# Patient Record
Sex: Female | Born: 1995 | Race: Black or African American | Hispanic: No | Marital: Single | Smoking: Never smoker
Health system: Southern US, Community
[De-identification: ages and names within clinical notes are randomized; demographics above are authoritative.]

## PROBLEM LIST (undated history)

## (undated) DIAGNOSIS — K900 Celiac disease: Secondary | ICD-10-CM

## (undated) DIAGNOSIS — K769 Liver disease, unspecified: Secondary | ICD-10-CM

## (undated) DIAGNOSIS — J309 Allergic rhinitis, unspecified: Secondary | ICD-10-CM

## (undated) DIAGNOSIS — G43909 Migraine, unspecified, not intractable, without status migrainosus: Secondary | ICD-10-CM

## (undated) DIAGNOSIS — K5190 Ulcerative colitis, unspecified, without complications: Secondary | ICD-10-CM

## (undated) DIAGNOSIS — J302 Other seasonal allergic rhinitis: Secondary | ICD-10-CM

## (undated) DIAGNOSIS — Z8489 Family history of other specified conditions: Secondary | ICD-10-CM

## (undated) DIAGNOSIS — D649 Anemia, unspecified: Secondary | ICD-10-CM

## (undated) DIAGNOSIS — Z932 Ileostomy status: Secondary | ICD-10-CM

## (undated) DIAGNOSIS — T753XXA Motion sickness, initial encounter: Secondary | ICD-10-CM

## (undated) DIAGNOSIS — I372 Nonrheumatic pulmonary valve stenosis with insufficiency: Secondary | ICD-10-CM

## (undated) DIAGNOSIS — Z87898 Personal history of other specified conditions: Secondary | ICD-10-CM

## (undated) HISTORY — PX: COLECTOMY: SHX59

---
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
## 2014-01-21 ENCOUNTER — Emergency Department: Payer: Self-pay | Admitting: Emergency Medicine

## 2014-02-27 ENCOUNTER — Emergency Department: Payer: Self-pay | Admitting: Emergency Medicine

## 2014-02-27 LAB — CBC WITH DIFFERENTIAL/PLATELET
Basophil #: 0 10*3/uL (ref 0.0–0.1)
Basophil %: 0.7 %
EOS PCT: 6.1 %
Eosinophil #: 0.3 10*3/uL (ref 0.0–0.7)
HCT: 38.4 % (ref 35.0–47.0)
HGB: 12.2 g/dL (ref 12.0–16.0)
LYMPHS PCT: 29.9 %
Lymphocyte #: 1.5 10*3/uL (ref 1.0–3.6)
MCH: 28.5 pg (ref 26.0–34.0)
MCHC: 31.9 g/dL — AB (ref 32.0–36.0)
MCV: 89 fL (ref 80–100)
MONO ABS: 0.5 x10 3/mm (ref 0.2–0.9)
Monocyte %: 9.5 %
Neutrophil #: 2.7 10*3/uL (ref 1.4–6.5)
Neutrophil %: 53.8 %
Platelet: 356 10*3/uL (ref 150–440)
RBC: 4.3 10*6/uL (ref 3.80–5.20)
RDW: 13 % (ref 11.5–14.5)
WBC: 5 10*3/uL (ref 3.6–11.0)

## 2014-02-27 LAB — URINALYSIS, COMPLETE
BILIRUBIN, UR: NEGATIVE
Bacteria: NONE SEEN
Blood: NEGATIVE
Glucose,UR: NEGATIVE mg/dL (ref 0–75)
Ketone: NEGATIVE
Leukocyte Esterase: NEGATIVE
Nitrite: NEGATIVE
PROTEIN: NEGATIVE
Ph: 7 (ref 4.5–8.0)
RBC,UR: 1 /HPF (ref 0–5)
Specific Gravity: 1.016 (ref 1.003–1.030)
Squamous Epithelial: 1
WBC UR: <1 /HPF (ref 0–5)

## 2014-02-27 LAB — COMPREHENSIVE METABOLIC PANEL
ALBUMIN: 3.2 g/dL — AB (ref 3.8–5.6)
ALK PHOS: 41 U/L — AB
ALT: 31 U/L
AST: 30 U/L — AB (ref 0–26)
Anion Gap: 7 (ref 7–16)
BILIRUBIN TOTAL: 0.4 mg/dL (ref 0.2–1.0)
BUN: 11 mg/dL (ref 7–18)
Calcium, Total: 8.3 mg/dL — ABNORMAL LOW (ref 9.0–10.7)
Chloride: 109 mmol/L — ABNORMAL HIGH (ref 98–107)
Co2: 23 mmol/L (ref 21–32)
Creatinine: 0.67 mg/dL (ref 0.60–1.30)
EGFR (Non-African Amer.): >60
Glucose: 75 mg/dL (ref 65–99)
Osmolality: 276 (ref 275–301)
POTASSIUM: 3.8 mmol/L (ref 3.5–5.1)
Sodium: 139 mmol/L (ref 136–145)
Total Protein: 6.9 g/dL (ref 6.4–8.6)

## 2014-02-27 LAB — LIPASE, BLOOD: LIPASE: 112 U/L (ref 73–393)

## 2014-03-21 ENCOUNTER — Emergency Department: Payer: Self-pay | Admitting: Emergency Medicine

## 2014-03-21 LAB — COMPREHENSIVE METABOLIC PANEL
ALK PHOS: 52 U/L
Albumin: 3.3 g/dL — ABNORMAL LOW (ref 3.8–5.6)
Anion Gap: 5 — ABNORMAL LOW (ref 7–16)
BILIRUBIN TOTAL: 0.4 mg/dL (ref 0.2–1.0)
BUN: 11 mg/dL (ref 7–18)
CHLORIDE: 107 mmol/L (ref 98–107)
CO2: 27 mmol/L (ref 21–32)
Calcium, Total: 8.7 mg/dL — ABNORMAL LOW (ref 9.0–10.7)
Creatinine: 0.67 mg/dL (ref 0.60–1.30)
Glucose: 92 mg/dL (ref 65–99)
Osmolality: 277 (ref 275–301)
POTASSIUM: 3.9 mmol/L (ref 3.5–5.1)
SGOT(AST): 23 U/L (ref 0–26)
SGPT (ALT): 33 U/L
SODIUM: 139 mmol/L (ref 136–145)
Total Protein: 6.7 g/dL (ref 6.4–8.6)

## 2014-03-21 LAB — URINALYSIS, COMPLETE
Bacteria: NONE SEEN
Bilirubin,UR: NEGATIVE
Blood: NEGATIVE
Glucose,UR: NEGATIVE mg/dL (ref 0–75)
KETONE: NEGATIVE
LEUKOCYTE ESTERASE: NEGATIVE
NITRITE: NEGATIVE
PH: 8 (ref 4.5–8.0)
Protein: NEGATIVE
RBC,UR: <1 /HPF (ref 0–5)
Specific Gravity: 1.019 (ref 1.003–1.030)
Squamous Epithelial: <1
WBC UR: 1 /HPF (ref 0–5)

## 2014-03-21 LAB — CBC
HCT: 37.4 % (ref 35.0–47.0)
HGB: 12.5 g/dL (ref 12.0–16.0)
MCH: 29.4 pg (ref 26.0–34.0)
MCHC: 33.4 g/dL (ref 32.0–36.0)
MCV: 88 fL (ref 80–100)
PLATELETS: 345 10*3/uL (ref 150–440)
RBC: 4.25 10*6/uL (ref 3.80–5.20)
RDW: 12.7 % (ref 11.5–14.5)
WBC: 4.7 10*3/uL (ref 3.6–11.0)

## 2014-03-21 LAB — TROPONIN I: Troponin-I: <0.02 ng/mL

## 2015-06-09 ENCOUNTER — Encounter: Payer: Self-pay | Admitting: Emergency Medicine

## 2015-06-09 ENCOUNTER — Emergency Department
Admission: EM | Admit: 2015-06-09 | Discharge: 2015-06-09 | Disposition: A | Discharge status: Home / Self Care | Payer: Medicaid Other | Attending: Emergency Medicine | Admitting: Emergency Medicine

## 2015-06-09 DIAGNOSIS — K2971 Gastritis, unspecified, with bleeding: Secondary | ICD-10-CM | POA: Diagnosis not present

## 2015-06-09 DIAGNOSIS — R195 Other fecal abnormalities: Secondary | ICD-10-CM | POA: Diagnosis present

## 2015-06-09 DIAGNOSIS — R109 Unspecified abdominal pain: Secondary | ICD-10-CM

## 2015-06-09 DIAGNOSIS — K922 Gastrointestinal hemorrhage, unspecified: Secondary | ICD-10-CM

## 2015-06-09 LAB — COMPREHENSIVE METABOLIC PANEL
ALK PHOS: 52 U/L (ref 38–126)
ALT: 17 U/L (ref 14–54)
ANION GAP: 4 — AB (ref 5–15)
AST: 20 U/L (ref 15–41)
Albumin: 3.4 g/dL — ABNORMAL LOW (ref 3.5–5.0)
BILIRUBIN TOTAL: 0.3 mg/dL (ref 0.3–1.2)
BUN: 11 mg/dL (ref 6–20)
CO2: 28 mmol/L (ref 22–32)
CREATININE: 0.55 mg/dL (ref 0.44–1.00)
Calcium: 8.5 mg/dL — ABNORMAL LOW (ref 8.9–10.3)
Chloride: 106 mmol/L (ref 101–111)
GFR calc non Af Amer: >60 mL/min (ref >60)
GLUCOSE: 82 mg/dL (ref 65–99)
Potassium: 3.8 mmol/L (ref 3.5–5.1)
Sodium: 138 mmol/L (ref 135–145)
Total Protein: 6.7 g/dL (ref 6.5–8.1)

## 2015-06-09 LAB — CBC
HCT: 34.4 % — ABNORMAL LOW (ref 35.0–47.0)
Hemoglobin: 11.4 g/dL — ABNORMAL LOW (ref 12.0–16.0)
MCH: 27.9 pg (ref 26.0–34.0)
MCHC: 33.1 g/dL (ref 32.0–36.0)
MCV: 84.1 fL (ref 80.0–100.0)
Platelets: 400 10*3/uL (ref 150–440)
RBC: 4.1 MIL/uL (ref 3.80–5.20)
RDW: 14.2 % (ref 11.5–14.5)
WBC: 9 10*3/uL (ref 3.6–11.0)

## 2015-06-09 MED ORDER — DICYCLOMINE HCL 20 MG PO TABS: 20.0000 mg | ORAL_TABLET | Freq: Three times a day (TID) | ORAL | Status: DC | PRN

## 2015-06-09 NOTE — ED Provider Notes (Signed)
[facility] Emergency Department Provider Note    ____________________________________________  Time seen: 1400  I have reviewed the triage vital signs and the nursing notes.   HISTORY  Chief Complaint Blood In Stools   History limited by: Not Limited   HPI Julie Bentley is a 19 y.o. female who presents to the emergency department today because of concerns for GI leading abdominal cramping. The patient states the symptoms going on for the past 10 days. They have been fairly constant. She states that initially the blood was a darker red but has become brighter red over the course of the past 10 days. Patient additionally states she has been having some diffuse abdominal pain. She denies any pain in the anus or rectum. She denies any excessive straining on the toilet. Patient has had intermittent episodes of GI bleeds over the course of the past couple of years. Her mother suffers from IBS. Patient denies any recent fevers, nausea or vomiting.     No past medical history on file.  There are no active problems to display for this patient.   No past surgical history on file.  No current outpatient prescriptions on file.  Allergies Review of patient's allergies indicates no known allergies.  No family history on file.  Social History Social History  Substance Use Topics  . Smoking status: Never Smoker   . Smokeless tobacco: None  . Alcohol Use: None    Review of Systems  Constitutional: Negative for fever. Cardiovascular: Negative for chest pain. Respiratory: Negative for shortness of breath. Gastrointestinal: Positive for abdominal pain, GI bleed Genitourinary: Negative for dysuria. Musculoskeletal: Negative for back pain. Skin: Negative for rash. Neurological: Negative for headaches, focal weakness or numbness.  10-point ROS otherwise negative.  ____________________________________________   PHYSICAL EXAM:  VITAL SIGNS: ED Triage  Vitals  Enc Vitals Group     BP 06/09/15 1205 102/56 mmHg     Pulse Rate 06/09/15 1205 70     Resp 06/09/15 1205 16     Temp 06/09/15 1205 98.5 F (36.9 C)     Temp Source 06/09/15 1205 Oral     SpO2 06/09/15 1205 100 %     Weight 06/09/15 1205 115 lb (52.164 kg)   Constitutional: Alert and oriented. Well appearing and in no distress. Eyes: Conjunctivae are normal. PERRL. Normal extraocular movements. ENT   Head: Normocephalic and atraumatic.   Nose: No congestion/rhinnorhea.   Mouth/Throat: Mucous membranes are moist.   Neck: No stridor. Hematological/Lymphatic/Immunilogical: No cervical lymphadenopathy. Cardiovascular: Normal rate, regular rhythm.  No murmurs, rubs, or gallops. Respiratory: Normal respiratory effort without tachypnea nor retractions. Breath sounds are clear and equal bilaterally. No wheezes/rales/rhonchi. Gastrointestinal: Soft and nontender. No distention.  Genitourinary: Deferred Musculoskeletal: Normal range of motion in all extremities. No joint effusions.  No lower extremity tenderness nor edema. Neurologic:  Normal speech and language. No gross focal neurologic deficits are appreciated.  Skin:  Skin is warm, dry and intact. No rash noted. Psychiatric: Mood and affect are normal. Speech and behavior are normal. Patient exhibits appropriate insight and judgment.  ____________________________________________    LABS (pertinent positives/negatives)  Labs Reviewed  COMPREHENSIVE METABOLIC PANEL - Abnormal; Notable for the following:    Calcium 8.5 (*)    Albumin 3.4 (*)    Anion gap 4 (*)    All other components within normal limits  CBC - Abnormal; Notable for the following:    Hemoglobin 11.4 (*)    HCT 34.4 (*)  All other components within normal limits     ____________________________________________   EKG  None  ____________________________________________     RADIOLOGY  None  ____________________________________________   PROCEDURES  Procedure(s) performed: None  Critical Care performed: No  ____________________________________________   INITIAL IMPRESSION / ASSESSMENT AND PLAN / ED COURSE  Pertinent labs & imaging results that were available during my care of the patient were reviewed by me and considered in my medical decision making (see chart for details).  Patient presented to the emergency department today because of concerns for abdominal pain and GI bleeding. This is asked potentially be the etiology of the pain and bleeding. Will give Bentyl. Discussed with the patient and family importance of following up with GI.  ____________________________________________   FINAL CLINICAL IMPRESSION(S) / ED DIAGNOSES  Final diagnoses:  Abdominal pain, unspecified abdominal location  Gastrointestinal hemorrhage, unspecified gastritis, unspecified gastrointestinal hemorrhage type     Phineas SemenGraydon Shaden Lacher, MD 06/09/15 1527

## 2015-06-09 NOTE — ED Notes (Signed)
Pt here for blood in stool.  Has been seen here for the same.  Reports abdominal pain when bowel movement. Reports about 6 bowel movements/day.

## 2015-06-09 NOTE — Discharge Instructions (Signed)
Please seek medical attention for any high fevers, chest pain, shortness of breath, change in behavior, persistent vomiting, bloody stool or any other new or concerning symptoms. ° ° °Abdominal Pain, Adult °Many things can cause abdominal pain. Usually, abdominal pain is not caused by a disease and will improve without treatment. It can often be observed and treated at home. Your health care provider will do a physical exam and possibly order blood tests and X-rays to help determine the seriousness of your pain. However, in many cases, more time must pass before a clear cause of the pain can be found. Before that point, your health care provider may not know if you need more testing or further treatment. °HOME CARE INSTRUCTIONS °Monitor your abdominal pain for any changes. The following actions may help to alleviate any discomfort you are experiencing: °· Only take over-the-counter or prescription medicines as directed by your health care provider. °· Do not take laxatives unless directed to do so by your health care provider. °· Try a clear liquid diet (broth, tea, or water) as directed by your health care provider. Slowly move to a bland diet as tolerated. °SEEK MEDICAL CARE IF: °· You have unexplained abdominal pain. °· You have abdominal pain associated with nausea or diarrhea. °· You have pain when you urinate or have a bowel movement. °· You experience abdominal pain that wakes you in the night. °· You have abdominal pain that is worsened or improved by eating food. °· You have abdominal pain that is worsened with eating fatty foods. °· You have a fever. °SEEK IMMEDIATE MEDICAL CARE IF: °· Your pain does not go away within 2 hours. °· You keep throwing up (vomiting). °· Your pain is felt only in portions of the abdomen, such as the right side or the left lower portion of the abdomen. °· You pass bloody or black tarry stools. °MAKE SURE YOU: °· Understand these instructions. °· Will watch your  condition. °· Will get help right away if you are not doing well or get worse. °  °This information is not intended to replace advice given to you by your health care provider. Make sure you discuss any questions you have with your health care provider. °  °Document Released: 03/25/2005 Document Revised: 03/06/2015 Document Reviewed: 02/22/2013 °Elsevier Interactive Patient Education ©2016 [employer] Inc[employer] ° °

## 2015-07-26 ENCOUNTER — Inpatient Hospital Stay: Payer: Medicaid Other

## 2015-07-26 ENCOUNTER — Inpatient Hospital Stay
Admission: AD | Admit: 2015-07-26 | Discharge: 2015-07-30 | DRG: 812 | Disposition: A | Discharge status: Home / Self Care | Payer: Medicaid Other | Source: Ambulatory Visit | Attending: Internal Medicine | Admitting: Internal Medicine

## 2015-07-26 DIAGNOSIS — D62 Acute posthemorrhagic anemia: Principal | ICD-10-CM | POA: Diagnosis present

## 2015-07-26 DIAGNOSIS — D649 Anemia, unspecified: Secondary | ICD-10-CM | POA: Diagnosis present

## 2015-07-26 DIAGNOSIS — Z79899 Other long term (current) drug therapy: Secondary | ICD-10-CM | POA: Diagnosis not present

## 2015-07-26 DIAGNOSIS — Z452 Encounter for adjustment and management of vascular access device: Secondary | ICD-10-CM

## 2015-07-26 DIAGNOSIS — K900 Celiac disease: Secondary | ICD-10-CM | POA: Diagnosis present

## 2015-07-26 DIAGNOSIS — E440 Moderate protein-calorie malnutrition: Secondary | ICD-10-CM | POA: Insufficient documentation

## 2015-07-26 DIAGNOSIS — Z800 Family history of malignant neoplasm of digestive organs: Secondary | ICD-10-CM | POA: Diagnosis present

## 2015-07-26 DIAGNOSIS — K5190 Ulcerative colitis, unspecified, without complications: Secondary | ICD-10-CM | POA: Diagnosis present

## 2015-07-26 HISTORY — DX: Ulcerative colitis, unspecified, without complications: K51.90

## 2015-07-26 HISTORY — DX: Celiac disease: K90.0

## 2015-07-26 LAB — COMPREHENSIVE METABOLIC PANEL
ALBUMIN: 2.3 g/dL — AB (ref 3.5–5.0)
ALT: 23 U/L (ref 14–54)
ANION GAP: 3 — AB (ref 5–15)
AST: 19 U/L (ref 15–41)
Alkaline Phosphatase: 52 U/L (ref 38–126)
BUN: 10 mg/dL (ref 6–20)
CHLORIDE: 105 mmol/L (ref 101–111)
CO2: 29 mmol/L (ref 22–32)
Calcium: 8 mg/dL — ABNORMAL LOW (ref 8.9–10.3)
Creatinine, Ser: 0.58 mg/dL (ref 0.44–1.00)
GFR calc Af Amer: >60 mL/min (ref >60)
GLUCOSE: 103 mg/dL — AB (ref 65–99)
POTASSIUM: 4.3 mmol/L (ref 3.5–5.1)
Sodium: 137 mmol/L (ref 135–145)
Total Bilirubin: 0.1 mg/dL — ABNORMAL LOW (ref 0.3–1.2)
Total Protein: 5.7 g/dL — ABNORMAL LOW (ref 6.5–8.1)

## 2015-07-26 LAB — CBC WITH DIFFERENTIAL/PLATELET
BASOS ABS: 0 10*3/uL (ref 0–0.1)
BASOS PCT: 0 %
BLASTS: 0 %
Band Neutrophils: 12 %
Eosinophils Absolute: 0 10*3/uL (ref 0–0.7)
HEMATOCRIT: 14.2 % — AB (ref 35.0–47.0)
HEMOGLOBIN: 4.4 g/dL — AB (ref 12.0–16.0)
LYMPHS ABS: 2.1 10*3/uL (ref 1.0–3.6)
LYMPHS PCT: 23 %
MCH: 23.7 pg — ABNORMAL LOW (ref 26.0–34.0)
MCHC: 31.2 g/dL — AB (ref 32.0–36.0)
MCV: 75.9 fL — ABNORMAL LOW (ref 80.0–100.0)
METAMYELOCYTES PCT: 1 %
MONO ABS: 0.3 10*3/uL (ref 0.2–0.9)
MYELOCYTES: 6 %
Monocytes Relative: 3 %
NEUTROS ABS: 6.7 10*3/uL — AB (ref 1.4–6.5)
Neutrophils Relative %: 54 %
Other: 0 %
PROMYELOCYTES ABS: 1 %
Platelets: 660 10*3/uL — ABNORMAL HIGH (ref 150–440)
RBC: 1.87 MIL/uL — AB (ref 3.80–5.20)
RDW: 17.6 % — ABNORMAL HIGH (ref 11.5–14.5)
WBC: 9.1 10*3/uL (ref 3.6–11.0)
nRBC: 3 /100 WBC — ABNORMAL HIGH

## 2015-07-26 LAB — PREPARE RBC (CROSSMATCH)

## 2015-07-26 LAB — ABO/RH: ABO/RH(D): O POS

## 2015-07-26 MED ORDER — TRAZODONE HCL 50 MG PO TABS: 25.0000 mg | ORAL_TABLET | Freq: Every evening | ORAL | Status: DC | PRN

## 2015-07-26 MED ORDER — SODIUM CHLORIDE 0.9 % IV SOLN
INTRAVENOUS | Status: DC
  Administered 2015-07-26 – 2015-07-29 (×7): via INTRAVENOUS

## 2015-07-26 MED ORDER — ACETAMINOPHEN 325 MG PO TABS: 650.0000 mg | ORAL_TABLET | Freq: Four times a day (QID) | ORAL | Status: DC | PRN

## 2015-07-26 MED ORDER — SODIUM CHLORIDE 0.9% FLUSH
3.0000 mL | Freq: Two times a day (BID) | INTRAVENOUS | Status: DC
  Administered 2015-07-28 – 2015-07-29 (×2): 3 mL via INTRAVENOUS

## 2015-07-26 MED ORDER — ALUM & MAG HYDROXIDE-SIMETH 200-200-20 MG/5ML PO SUSP: 30.0000 mL | Freq: Four times a day (QID) | ORAL | Status: DC | PRN

## 2015-07-26 MED ORDER — BISACODYL 5 MG PO TBEC: 5.0000 mg | DELAYED_RELEASE_TABLET | Freq: Every day | ORAL | Status: DC | PRN

## 2015-07-26 MED ORDER — DOCUSATE SODIUM 100 MG PO CAPS
100.0000 mg | ORAL_CAPSULE | Freq: Two times a day (BID) | ORAL | Status: DC
Start: 2015-07-26 — End: 2015-07-30
  Filled 2015-07-26 (×5): qty 1

## 2015-07-26 MED ORDER — ONDANSETRON HCL 4 MG/2ML IJ SOLN
4.0000 mg | Freq: Four times a day (QID) | INTRAMUSCULAR | Status: DC | PRN
  Filled 2015-07-26: qty 2

## 2015-07-26 MED ORDER — DICYCLOMINE HCL 20 MG PO TABS
20.0000 mg | ORAL_TABLET | Freq: Three times a day (TID) | ORAL | Status: DC | PRN
  Filled 2015-07-26: qty 1

## 2015-07-26 MED ORDER — SODIUM CHLORIDE 0.9 % IV SOLN
Freq: Once | INTRAVENOUS | Status: AC
  Administered 2015-07-26: 20:00:00 via INTRAVENOUS

## 2015-07-26 MED ORDER — HYDROCODONE-ACETAMINOPHEN 5-325 MG PO TABS: 1.0000 | ORAL_TABLET | ORAL | Status: DC | PRN

## 2015-07-26 MED ORDER — PREDNISONE 20 MG PO TABS
40.0000 mg | ORAL_TABLET | Freq: Every day | ORAL | Status: DC
  Filled 2015-07-26: qty 2

## 2015-07-26 MED ORDER — ONDANSETRON HCL 4 MG PO TABS: 4.0000 mg | ORAL_TABLET | Freq: Four times a day (QID) | ORAL | Status: DC | PRN

## 2015-07-26 MED ORDER — ACETAMINOPHEN 650 MG RE SUPP: 650.0000 mg | Freq: Four times a day (QID) | RECTAL | Status: DC | PRN

## 2015-07-26 NOTE — Consult Note (Signed)
GI Inpatient Consult Note  Reason for Consult: severe anemia, UC.    Attending Requesting Consult: Dr Manuella Ghazi  History of Present Illness: Julie Bentley is a 20 y.o. female with newly diagnosed severe pan-UC and newly diagnosed celiac dz.  Was seen in GI clinic for diarhrea, rectal bleeding, wt loss and just had colonsocpy showing severe pan-colitis except for cecum Biopsies show chronic active colitis.   Also had EGD for positive celiac serology and small bowel biopsies confirm celiac disease.    She was started on PO pred taper, gluten free diet, and bowels were getting better. Was to be seen in clinic today and labs showed hgb of 4.  Stool frequentcy decreased to 3 - 4 times per day and bleeding also much decreased.  Tolerating PO.  No f/c, no n/v.  Trying to gain weight.   Was sent to hospital for direct admission, PRBC.   Past Medical History:  Past Medical History  Diagnosis Date  . UC (ulcerative colitis) ([place])   . Celiac disease     Problem List: Patient Active Problem List   Diagnosis Date Noted  . Anemia 07/26/2015  . UC (ulcerative colitis) ([place]) 07/26/2015  . Celiac disease 07/26/2015    Past Surgical History: History reviewed. No pertinent past surgical history.   Allergies: No Known Allergies   Home Medications: Prescriptions prior to admission  Medication Sig Dispense Refill Last Dose  . cetirizine (ZYRTEC) 10 MG tablet Take 1 tablet by mouth daily.   07/25/2015 at Unknown time  . predniSONE (DELTASONE) 10 MG tablet 40 mg x 7 days; then 30 mg x 7 days; then 20 mg x 7 days; then 10 mg x 7 days; then 5 mg x 7 days & Discontinue   07/26/2015 at Unknown time  . dicyclomine (BENTYL) 20 MG tablet Take 1 tablet (20 mg total) by mouth 3 (three) times daily as needed for spasms. (Patient not taking: Reported on 07/26/2015) 30 tablet 0 Not Taking at Unknown time   Home medication reconciliation was completed with the patient.   Scheduled Inpatient Medications:   .  docusate sodium  100 mg Oral BID  . sodium chloride flush  3 mL Intravenous Q12H    Continuous Inpatient Infusions:   . sodium chloride 100 mL/hr at 07/26/15 1852    PRN Inpatient Medications:  acetaminophen **OR** acetaminophen, alum & mag hydroxide-simeth, bisacodyl, dicyclomine, HYDROcodone-acetaminophen, ondansetron **OR** ondansetron (ZOFRAN) IV, traZODone  Family History:  The patient's family history is positive for inflammatory bowel disorders.  Negative for GI malignancy, or solid organ transplantation.  Social History:   reports that she has never smoked. She does not have any smokeless tobacco history on file. The patient denies ETOH, tobacco, or drug use.   Review of Systems: Constitutional: Weight is down Eyes: No changes in vision. ENT: No oral lesions, sore throat.  GI: see HPI.  Heme/Lymph: + easy bruising.  CV: No chest pain.  GU: No hematuria.  Integumentary: No rashes.  Neuro: No headaches.  Psych:  + depression/anxiety.  Endocrine: No heat/cold intolerance.  Allergic/Immunologic: No urticaria.  Resp: No cough, SOB.  Musculoskeletal: No joint swelling.    Physical Examination: BP 119/68 mmHg  Pulse 108  Temp(Src) 98.7 F (37.1 C) (Oral)  Resp 20  Ht 5' 5"  (1.651 m)  Wt 55.929 kg (123 lb 4.8 oz)  BMI 20.52 kg/m2  SpO2 100% Gen: NAD, alert and oriented x 4, + appears underweight.  HEENT: PEERLA, EOMI, Neck: supple, no JVD or  thyromegaly Chest: CTA bilaterally, no wheezes, crackles, or other adventitious sounds CV: RRR, no m/g/c/r Abd: soft, + mild diffuse TTP, ND, +BS in all four quadrants; no HSM, guarding, ridigity, or rebound tenderness Ext: no edema, well perfused with 2+ pulses, Skin: no rash or lesions noted Lymph: no LAD  Data: Lab Results  Component Value Date   WBC 9.1 07/26/2015   HGB 4.4* 07/26/2015   HCT 14.2* 07/26/2015   MCV 75.9* 07/26/2015   PLT 660* 07/26/2015    Recent Labs Lab 07/26/15 1828  HGB 4.4*   Lab  Results  Component Value Date   NA 137 07/26/2015   K 4.3 07/26/2015   CL 105 07/26/2015   CO2 29 07/26/2015   BUN 10 07/26/2015   CREATININE 0.58 07/26/2015   Lab Results  Component Value Date   ALT 23 07/26/2015   AST 19 07/26/2015   ALKPHOS 52 07/26/2015   BILITOT 0.1* 07/26/2015   No results for input(s): APTT, INR, PTT in the last 168 hours.   Assessment/Plan: Ms. Labate is a 20 y.o. female with newly diagnosed severe pan-UC, newly diagnosed celiac dz, severe anemia.   Recommendations: - tranfuse to Hgb > 7 - cont PO pred for now since good response - gluten free diet - applying for Humuira as outpt once HbSAg, quanterferon resulted - safe for d/c once hgb > 7 and stable, and diarrhea < 2 -3 per day.  - will follow.   Thank you for the consult. Please call with questions or concerns.  REIN, Grace Blight, MD

## 2015-07-26 NOTE — Progress Notes (Signed)
Patients mother requested to speak with Dr Sherryll Burger regarding placement of a PICC or Central line. Daughter does not want to be stuck? Per Dr Sherryll Burger we will order PICC line- Then get cross matched and screened once PICC is in place. Per Dr Clelia Croft it is not emergent. Floor care orders enter. Patient alert and oriented, no signs of bleeding and no complaints of pain.

## 2015-07-26 NOTE — Plan of Care (Signed)
Problem: Education: Goal: Knowledge of [facility] Education information/materials will improve Outcome: Progressing Pt PICC placed late this afternoon, to receive 2 units PRBC pending labs/blood, pt with no distress or discomfort noted, no complaints

## 2015-07-26 NOTE — H&P (Signed)
[place] at [place] NAME: Julie Bentley    MR#:  [id number]  DATE OF BIRTH:  26-May-1996  DATE OF ADMISSION:  07/26/2015  PRIMARY CARE PHYSICIAN: [facility]   REQUESTING/REFERRING PHYSICIAN: Tammi Klippel (NP for Dr Dorise Bullion) - [place]:  No chief complaint on file. weakness  HISTORY OF PRESENT ILLNESS:  Julie Bentley  is a 20 y.o. female with a recently diagnosed ulcerative colitis and possible celiac disease is being admitted for severe anemia as a direct admit from [facility] GI office.  Patient has been having on and off constipation, diarrhea for at least last 2-3 years was contemplating with a diagnosis of irritable bowel syndrome.  Finally, in December 2016.  She went to see GI doctor as she was having lower GI bleeding along with diarrhea and was quite symptomatic with lightheadedness.  He underwent colonoscopy about a week ago after which she was started on prednisone taper, as there was concern for ulcerative colitis and or celiac disease.  She was started on prednisone 80 mg with 20 mg taper every week.  She felt significant improvement with decrease in her breathing.  He last 1 week.  Improved appetite and overall improvement in her symptoms.  She saw GI office this Monday and had a blood work, which he returned today showing hemoglobin of 4.9 for which she is being requested for direct admit,.  She reports weight loss of 420 pounds over last several months, but recently gained about 10 pounds back.  Her mother is at bedside. PAST MEDICAL HISTORY:  No past medical history on file. 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  SOCIAL HISTORY:   Social History  Substance Use Topics  . Smoking status: Never Smoker   . Smokeless tobacco: Not on file  . Alcohol Use: Not on file  She is a Ship broker at [employer]:  No family history on file. 2 sisters had necrotizing enterocolitis requiring surgery, now with short bowel syndrome Mother has IBS Mother's side, grandfather had colon cancer DRUG ALLERGIES:  No Known Allergies  REVIEW OF SYSTEMS:   Review of Systems  Constitutional: Positive for weight loss and malaise/fatigue. Negative for fever and diaphoresis.  HENT: Negative for ear discharge, ear pain, hearing loss, nosebleeds, sore throat and tinnitus.   Eyes: Negative for blurred vision and pain.  Respiratory: Negative for cough, hemoptysis, shortness of breath and wheezing.   Cardiovascular: Negative for chest pain, palpitations, orthopnea and leg swelling.  Gastrointestinal: Positive for diarrhea and blood in stool. Negative for heartburn, nausea, vomiting, abdominal pain and constipation.  Genitourinary: Negative for dysuria, urgency and frequency.  Musculoskeletal: Negative for myalgias and back pain.  Skin: Negative for itching and rash.  Neurological: Positive for weakness. Negative for dizziness, tingling, tremors, focal weakness, seizures and headaches.  Psychiatric/Behavioral: Negative for depression. The patient is not nervous/anxious.    MEDICATIONS AT HOME:   Prior to Admission medications   Medication Sig Start Date End Date Taking? Authorizing Provider  cetirizine (ZYRTEC) 10 MG tablet Take 1 tablet by mouth daily. 04/29/15  Yes Historical Provider, MD  predniSONE (DELTASONE) 10 MG tablet 40 mg x 7 days; then 30 mg x 7 days; then 20 mg x 7 days; then 10 mg x 7 days; then 5 mg x 7 days & Discontinue 07/17/15  Yes Historical  Provider, MD  dicyclomine (BENTYL) 20 MG tablet Take 1 tablet (20 mg total) by mouth 3 (three) times daily as needed for spasms. Patient not taking: Reported on 07/26/2015 06/09/15 06/08/16  Nance Pear, MD      VITAL SIGNS:  Blood pressure 111/67, pulse 97, temperature 98.1 F (36.7 C), temperature source Oral, resp. rate 16,  SpO2 100 %. PHYSICAL EXAMINATION:  Physical Exam  Constitutional: She is oriented to person, place, and time. Vital signs are normal. She appears malnourished and dehydrated. She appears unhealthy. She appears cachectic. She has a sickly appearance.  HENT:  Head: Normocephalic and atraumatic.  Eyes: Conjunctivae and EOM are normal. Pupils are equal, round, and reactive to light.  Neck: Normal range of motion. Neck supple. No tracheal deviation present. No thyromegaly present.  Cardiovascular: Normal rate, regular rhythm and normal heart sounds.   Pulmonary/Chest: Effort normal and breath sounds normal. No respiratory distress. She has no wheezes. She exhibits no tenderness.  Abdominal: Soft. Bowel sounds are normal. She exhibits no distension. There is no tenderness.  Musculoskeletal: Normal range of motion.  Neurological: She is alert and oriented to person, place, and time. No cranial nerve deficit.  Skin: Skin is warm and dry. No rash noted. There is pallor.  Psychiatric: Mood and affect normal.   IMPRESSION AND PLAN:  20 year old female with a recent diagnosis of possible ulcerative colitis and/or celiac disease, now being admitted for severe anemia  * Severe anemia: with reported hemoglobin of 4.9, likely acute blood loss.  Will order 2 units of packed red blood cell transfusion.  Consult GI.  * Ulcerative colitis and/or celiac disease: Continue high-dose prednisone as already started by GI, as that seemed to have helped.  * Weakness: Likely from severe anemia  * Diarrhea and blood in the stool:  improved since starting prednisone.  We will continue same    All the records are reviewed and case discussed with ED provider. Management plans discussed with the patient, family and they are in agreement.  CODE STATUS: Full code  TOTAL TIME TAKING CARE OF THIS PATIENT: 55 minutes.    [facility], Tyree Fluharty M.D on 07/26/2015 at 2:51 PM  Between 7am to 6pm - Pager - [phone]  After 6pm  go to [email or web address] - password [email or web address] [place] [facility]  Office  [phone]  CC: Primary care physician; [facility] [place] Pall MD  Note: This dictation was prepared with Dragon dictation along with smaller phrase technology. Any transcriptional errors that result from this process are unintentional.

## 2015-07-27 LAB — CBC
HCT: 23 % — ABNORMAL LOW (ref 35.0–47.0)
Hemoglobin: 7.4 g/dL — ABNORMAL LOW (ref 12.0–16.0)
MCH: 25.9 pg — AB (ref 26.0–34.0)
MCHC: 32.4 g/dL (ref 32.0–36.0)
MCV: 80 fL (ref 80.0–100.0)
PLATELETS: 562 10*3/uL — AB (ref 150–440)
RBC: 2.87 MIL/uL — ABNORMAL LOW (ref 3.80–5.20)
RDW: 16.6 % — AB (ref 11.5–14.5)
WBC: 12.6 10*3/uL — ABNORMAL HIGH (ref 3.6–11.0)

## 2015-07-27 LAB — BASIC METABOLIC PANEL
Anion gap: 4 — ABNORMAL LOW (ref 5–15)
BUN: 10 mg/dL (ref 6–20)
CALCIUM: 7.8 mg/dL — AB (ref 8.9–10.3)
CHLORIDE: 105 mmol/L (ref 101–111)
CO2: 28 mmol/L (ref 22–32)
CREATININE: 0.57 mg/dL (ref 0.44–1.00)
GFR calc Af Amer: >60 mL/min (ref >60)
GFR calc non Af Amer: >60 mL/min (ref >60)
GLUCOSE: 87 mg/dL (ref 65–99)
Potassium: 4.1 mmol/L (ref 3.5–5.1)
Sodium: 137 mmol/L (ref 135–145)

## 2015-07-27 LAB — GLUCOSE, CAPILLARY: GLUCOSE-CAPILLARY: 82 mg/dL (ref 65–99)

## 2015-07-27 LAB — HEMOGLOBIN AND HEMATOCRIT, BLOOD
HCT: 25.1 % — ABNORMAL LOW (ref 35.0–47.0)
HCT: 24.1 % — ABNORMAL LOW (ref 35.0–47.0)
HEMOGLOBIN: 7.6 g/dL — AB (ref 12.0–16.0)
Hemoglobin: 8.3 g/dL — ABNORMAL LOW (ref 12.0–16.0)

## 2015-07-27 MED ORDER — PREDNISONE 20 MG PO TABS
30.0000 mg | ORAL_TABLET | Freq: Every day | ORAL | Status: AC
  Administered 2015-07-27 – 2015-07-28 (×2): 30 mg via ORAL
  Filled 2015-07-27: qty 1

## 2015-07-27 NOTE — Progress Notes (Signed)
[place] at [place] NAME: Julie Bentley    MR#:  [id number]  DATE OF BIRTH:  [birth year]  SUBJECTIVE:  CHIEF COMPLAINT:  Patient is reporting abdominal cramps in the lower abdominal area prior to bowel movements. Still having bloody bowel movement. Denies any nausea or vomiting. On by mouth prednisone  REVIEW OF SYSTEMS:  CONSTITUTIONAL: No fever, fatigue or weakness.  EYES: No blurred or double vision.  EARS, NOSE, AND THROAT: No tinnitus or ear pain.  RESPIRATORY: No cough, shortness of breath, wheezing or hemoptysis.  CARDIOVASCULAR: No chest pain, orthopnea, edema.  GASTROINTESTINAL: No nausea, vomiting, reporting bloody diarrhea and abdominal cramps prior to bowel movements  GENITOURINARY: No dysuria, hematuria.  ENDOCRINE: No polyuria, nocturia,  HEMATOLOGY: No anemia, easy bruising or bleeding SKIN: No rash or lesion. MUSCULOSKELETAL: No joint pain or arthritis.   NEUROLOGIC: No tingling, numbness, weakness.  PSYCHIATRY: No anxiety or depression.   DRUG ALLERGIES:  No Known Allergies  VITALS:  Blood pressure 97/59, pulse 80, temperature 98.4 F (36.9 C), temperature source Oral, resp. rate 20, height 5' 5"  (1.651 m), weight 54.568 kg (120 lb 4.8 oz), SpO2 100 %.  PHYSICAL EXAMINATION:  GENERAL:  20 y.o.-year-old patient lying in the bed with no acute distress.  EYES: Pupils equal, round, reactive to light and accommodation. No scleral icterus. Extraocular muscles intact.  HEENT: Head atraumatic, normocephalic. Oropharynx and nasopharynx clear.  NECK:  Supple, no jugular venous distention. No thyroid enlargement, no tenderness.  LUNGS: Normal breath sounds bilaterally, no wheezing, rales,rhonchi or crepitation. No use of accessory muscles of respiration.  CARDIOVASCULAR: S1, S2 normal. No murmurs, rubs, or gallops.  ABDOMEN: Soft, nontender, nondistended. Bowel sounds present. No organomegaly or mass.  EXTREMITIES: No  pedal edema, cyanosis, or clubbing.  NEUROLOGIC: Cranial nerves II through XII are intact. Muscle strength 5/5 in all extremities. Sensation intact. Gait not checked.  PSYCHIATRIC: The patient is alert and oriented x 3.  SKIN: No obvious rash, lesion, or ulcer.    LABORATORY PANEL:   CBC  Recent Labs Lab 07/27/15 0505  WBC 12.6*  HGB 7.4*  HCT 23.0*  PLT 562*   ------------------------------------------------------------------------------------------------------------------  Chemistries   Recent Labs Lab 07/26/15 1828 07/27/15 0505  NA 137 137  K 4.3 4.1  CL 105 105  CO2 29 28  GLUCOSE 103* 87  BUN 10 10  CREATININE 0.58 0.57  CALCIUM 8.0* 7.8*  AST 19  --   ALT 23  --   ALKPHOS 52  --   BILITOT 0.1*  --    ------------------------------------------------------------------------------------------------------------------  Cardiac Enzymes No results for input(s): TROPONINI in the last 168 hours. ------------------------------------------------------------------------------------------------------------------  RADIOLOGY:  Dg Chest 1 View  07/26/2015  CLINICAL DATA:  Status post PICC line placement EXAM: CHEST 1 VIEW COMPARISON:  9/23/fifth FINDINGS: Cardiac shadow is within normal limits. The lungs are clear bilaterally. A new right-sided PICC line is noted at the cavoatrial junction in satisfactory position. No bony abnormality is seen. IMPRESSION: PICC line in satisfactory position. Electronically Signed   By: Inez Catalina M.D.   On: 07/26/2015 18:15    EKG:   Orders placed or performed in visit on 03/21/14  . EKG 12-Lead    ASSESSMENT AND PLAN:   20 year old female with a recent diagnosis of possible ulcerative colitis and/or celiac disease, now being admitted for severe anemia  * Severe anemia: with reported hemoglobin of 4.9, likely acute blood loss secondary to severe pan ulcerative colitis  and newly diagnosed celiac disease Hemoglobin 4.4-7.4 today   Status post 2 units of packed red blood cell transfusion. Monitor hemoglobin and hematocrit closely and transfuse as needed to keep hemoglobin greater than 7 Appreciate GI recommendations Continue by mouth prednisone We will do discharge patient if hemoglobin is stable at are greater than 7 and bowel movements are less than 2-3 per day  Apply for outpatient Humuira  * Ulcerative colitis and/or celiac disease: Continue high-dose prednisone as already started by GI, as that seemed to have helped.  * Weakness: Likely from severe anemia  * Diarrhea and blood in the stool: improved since starting prednisone. We will continue same      All the records are reviewed and case discussed with Care Management/Social Workerr. Management plans discussed with the patient, family and they are in agreement.  CODE STATUS:   TOTAL TIME TAKING CARE OF THIS PATIENT: 67mnutes.   POSSIBLE D/C IN 1-2 DAYS, DEPENDING ON CLINICAL CONDITION.   GNicholes MangoM.D on 07/27/2015 at 4:24 PM  Between 7am to 6pm - Pager - [phone] 6pm go to [email or web address] - password [email or web address]  Office  [phone] CC: Primary care physician; DBayou [place]

## 2015-07-27 NOTE — Plan of Care (Signed)
Problem: Education: Goal: Knowledge of [facility] Education information/materials will improve Outcome: Completed/Met Date Met:  07/27/15 Hgb 7.4 this morning pt continues to have loose stools with streaks of blood, MD aware     

## 2015-07-28 LAB — HEMOGLOBIN AND HEMATOCRIT, BLOOD
HCT: 25 % — ABNORMAL LOW (ref 35.0–47.0)
HEMATOCRIT: 24.8 % — AB (ref 35.0–47.0)
HEMOGLOBIN: 8 g/dL — AB (ref 12.0–16.0)
HEMOGLOBIN: 8 g/dL — AB (ref 12.0–16.0)

## 2015-07-28 LAB — GLUCOSE, CAPILLARY: Glucose-Capillary: 88 mg/dL (ref 65–99)

## 2015-07-28 MED ORDER — METHYLPREDNISOLONE SODIUM SUCC 40 MG IJ SOLR
20.0000 mg | Freq: Three times a day (TID) | INTRAMUSCULAR | Status: DC
  Administered 2015-07-28 – 2015-07-30 (×6): 20 mg via INTRAVENOUS
  Filled 2015-07-28 (×6): qty 1

## 2015-07-28 NOTE — Plan of Care (Signed)
Problem: Bowel/Gastric: Goal: Will show no signs and symptoms of gastrointestinal bleeding Outcome: Progressing Pt had 2x small loose/soft,brown with red streaks stools during the shift. Denies N/V/pain. Good appetite.

## 2015-07-28 NOTE — Progress Notes (Signed)
[place] at [place] NAME: Julie Bentley    MR#:  [id number]  DATE OF BIRTH:  September 28, 1995  SUBJECTIVE:  CHIEF COMPLAINT:  Patient is feeling better , but had bloody BM in the past  24 hrs.Improving abdominal cramps .On by mouth prednisone  REVIEW OF SYSTEMS:  CONSTITUTIONAL: No fever, fatigue or weakness.  EYES: No blurred or double vision.  EARS, NOSE, AND THROAT: No tinnitus or ear pain.  RESPIRATORY: No cough, shortness of breath, wheezing or hemoptysis.  CARDIOVASCULAR: No chest pain, orthopnea, edema.  GASTROINTESTINAL: No nausea, vomiting, reporting bloody diarrhea and few abdominal cramps prior to bowel movements  GENITOURINARY: No dysuria, hematuria.  ENDOCRINE: No polyuria, nocturia,  HEMATOLOGY: No anemia, easy bruising or bleeding SKIN: No rash or lesion. MUSCULOSKELETAL: No joint pain or arthritis.   NEUROLOGIC: No tingling, numbness, weakness.  PSYCHIATRY: No anxiety or depression.   DRUG ALLERGIES:  No Known Allergies  VITALS:  Blood pressure 116/72, pulse 97, temperature 98 F (36.7 C), temperature source Oral, resp. rate 18, height 5' 5"  (1.651 m), weight 56.609 kg (124 lb 12.8 oz), SpO2 100 %.  PHYSICAL EXAMINATION:  GENERAL:  20 y.o.-year-old patient lying in the bed with no acute distress.  EYES: Pupils equal, round, reactive to light and accommodation. No scleral icterus. Extraocular muscles intact.  HEENT: Head atraumatic, normocephalic. Oropharynx and nasopharynx clear.  NECK:  Supple, no jugular venous distention. No thyroid enlargement, no tenderness.  LUNGS: Normal breath sounds bilaterally, no wheezing, rales,rhonchi or crepitation. No use of accessory muscles of respiration.  CARDIOVASCULAR: S1, S2 normal. No murmurs, rubs, or gallops.  ABDOMEN: Soft, nontender, nondistended. Bowel sounds present. No organomegaly or mass.  EXTREMITIES: No pedal edema, cyanosis, or clubbing.  NEUROLOGIC: Cranial  nerves II through XII are intact. Muscle strength 5/5 in all extremities. Sensation intact. Gait not checked.  PSYCHIATRIC: The patient is alert and oriented x 3.  SKIN: No obvious rash, lesion, or ulcer.    LABORATORY PANEL:   CBC  Recent Labs Lab 07/27/15 0505  07/28/15 0618  WBC 12.6*  --   --   HGB 7.4*  < > 8.0*  HCT 23.0*  < > 25.0*  PLT 562*  --   --   < > = values in this interval not displayed. ------------------------------------------------------------------------------------------------------------------  Chemistries   Recent Labs Lab 07/26/15 1828 07/27/15 0505  NA 137 137  K 4.3 4.1  CL 105 105  CO2 29 28  GLUCOSE 103* 87  BUN 10 10  CREATININE 0.58 0.57  CALCIUM 8.0* 7.8*  AST 19  --   ALT 23  --   ALKPHOS 52  --   BILITOT 0.1*  --    ------------------------------------------------------------------------------------------------------------------  Cardiac Enzymes No results for input(s): TROPONINI in the last 168 hours. ------------------------------------------------------------------------------------------------------------------  RADIOLOGY:  Dg Chest 1 View  07/26/2015  CLINICAL DATA:  Status post PICC line placement EXAM: CHEST 1 VIEW COMPARISON:  9/23/fifth FINDINGS: Cardiac shadow is within normal limits. The lungs are clear bilaterally. A new right-sided PICC line is noted at the cavoatrial junction in satisfactory position. No bony abnormality is seen. IMPRESSION: PICC line in satisfactory position. Electronically Signed   By: Inez Catalina M.D.   On: 07/26/2015 18:15    EKG:   Orders placed or performed in visit on 03/21/14  . EKG 12-Lead    ASSESSMENT AND PLAN:   20 year old female with a recent diagnosis of possible ulcerative colitis and/or celiac disease,  now being admitted for severe anemia  * Severe anemia: with reported hemoglobin of 4.9, l2/2 acute blood loss secondary to severe pan ulcerative colitis and newly diagnosed  celiac disease Hemoglobin 4.4-7.4 --> 8.0 today  Status post 2 units of packed red blood cell transfusion. Monitor hemoglobin and hematocrit closely and transfuse as needed to keep hemoglobin greater than 7 Appreciate GI recommendations, changing to IV steroids as pt is not clinically improving much We will do discharge patient if hemoglobin is stable at or greater than 7 and bowel movements are less than 2-3 per day  Apply for outpatient Humuira  * Ulcerative colitis and/or celiac disease:  Solumedrol 20 iv q 8 hrs  * Weakness: Likely from severe anemia  * Diarrhea and blood in the stool: started iv solumedrol       All the records are reviewed and case discussed with Care Management/Social Workerr. Management plans discussed with the patient, family and they are in agreement.  CODE STATUS:   TOTAL TIME TAKING CARE OF THIS PATIENT: 35mnutes.   POSSIBLE D/C IN 1-2 DAYS, DEPENDING ON CLINICAL CONDITION.   GNicholes MangoM.D on 07/28/2015 at 5:52 PM  Between 7am to 6pm - Pager - [phone] 6pm go to [email or web address] - password [email or web address] [facility]ospital[facility]  Office  [phone] CC: Primary care physician; [place]

## 2015-07-28 NOTE — Progress Notes (Signed)
GI Inpatient Follow-up Note  Patient Identification: Julie Bentley is a 20 y.o. female with severe UC and celiac disease a/w severe anemia  Subjective:  5 stools in 24 hours and much more bleeding than before.  No abd pain, n/v, f/c.   On PO prednisone.  Tolerating PO well.    Scheduled Inpatient Medications:  . docusate sodium  100 mg Oral BID  . methylPREDNISolone (SOLU-MEDROL) injection  20 mg Intravenous 3 times per day  . sodium chloride flush  3 mL Intravenous Q12H    Continuous Inpatient Infusions:   . sodium chloride 100 mL/hr at 07/28/15 1820    PRN Inpatient Medications:  acetaminophen **OR** acetaminophen, alum & mag hydroxide-simeth, bisacodyl, dicyclomine, HYDROcodone-acetaminophen, ondansetron **OR** ondansetron (ZOFRAN) IV, traZODone  Review of Systems: Constitutional: Weight is down Eyes: No changes in vision. ENT: No oral lesions, sore throat.  GI: see HPI.  Heme/Lymph: No easy bruising.  CV: No chest pain.  GU: No hematuria.  Integumentary: No rashes.  Neuro: No headaches.  Psych: No depression/anxiety.  Endocrine: No heat/cold intolerance.  Allergic/Immunologic: No urticaria.  Resp: No cough, SOB.  Musculoskeletal: No joint swelling.    Physical Examination: BP 116/72 mmHg  Pulse 97  Temp(Src) 98 F (36.7 C) (Oral)  Resp 18  Ht  (1.651 m)  Wt 56.609 kg (124 lb 12.8 oz)  BMI 20.77 kg/m2  SpO2 100% Gen: NAD, alert and oriented x 4 HEENT: PEERLA, EOMI, Neck: supple, no JVD or thyromegaly Chest: CTA bilaterally, no wheezes, crackles, or other adventitious sounds CV: RRR, no m/g/c/r Abd: soft, NT, ND, +BS in all four quadrants; no HSM, guarding, ridigity, or rebound tenderness Ext: no edema, well perfused with 2+ pulses, Skin: no rash or lesions noted Lymph: no LAD  Data: Lab Results  Component Value Date   WBC 12.6* 07/27/2015   HGB 8.0* 07/28/2015   HCT 24.8* 07/28/2015   MCV 80.0 07/27/2015   PLT 562* 07/27/2015    Recent  Labs Lab 07/27/15 2326 07/28/15 0618 07/28/15 1849  HGB 7.6* 8.0* 8.0*   Lab Results  Component Value Date   NA 137 07/27/2015   K 4.1 07/27/2015   CL 105 07/27/2015   CO2 28 07/27/2015   BUN 10 07/27/2015   CREATININE 0.57 07/27/2015   Lab Results  Component Value Date   ALT 23 07/26/2015   AST 19 07/26/2015   ALKPHOS 52 07/26/2015   BILITOT 0.1* 07/26/2015   No results for input(s): APTT, INR, PTT in the last 168 hours.   Assessment/Plan: Ms. Droessler is a 20 y.o. female with severe UC and celiac dz.  More diarrhea and more bleeding in past 24 hours.   Recommendations: - start solumedrol 20 mg q8 hour - follow hgb - gluten free diet - applying for Humira through insurance.   Please call with questions or concerns.  Mercer Peifer, Addison Naegeli, MD

## 2015-07-29 DIAGNOSIS — E440 Moderate protein-calorie malnutrition: Secondary | ICD-10-CM | POA: Insufficient documentation

## 2015-07-29 LAB — CBC
HCT: 22.8 % — ABNORMAL LOW (ref 35.0–47.0)
HEMOGLOBIN: 7.2 g/dL — AB (ref 12.0–16.0)
MCH: 25 pg — ABNORMAL LOW (ref 26.0–34.0)
MCHC: 31.6 g/dL — AB (ref 32.0–36.0)
MCV: 79.1 fL — ABNORMAL LOW (ref 80.0–100.0)
Platelets: 539 10*3/uL — ABNORMAL HIGH (ref 150–440)
RBC: 2.88 MIL/uL — AB (ref 3.80–5.20)
RDW: 16.7 % — ABNORMAL HIGH (ref 11.5–14.5)
WBC: 13.5 10*3/uL — AB (ref 3.6–11.0)

## 2015-07-29 LAB — HEMOGLOBIN AND HEMATOCRIT, BLOOD
HEMATOCRIT: 27.1 % — AB (ref 35.0–47.0)
HEMOGLOBIN: 8.7 g/dL — AB (ref 12.0–16.0)

## 2015-07-29 LAB — GLUCOSE, CAPILLARY: GLUCOSE-CAPILLARY: 98 mg/dL (ref 65–99)

## 2015-07-29 LAB — PREPARE RBC (CROSSMATCH)

## 2015-07-29 MED ORDER — ENSURE ENLIVE PO LIQD
237.0000 mL | Freq: Two times a day (BID) | ORAL | Status: DC
  Administered 2015-07-29 – 2015-07-30 (×2): 237 mL via ORAL

## 2015-07-29 MED ORDER — SODIUM CHLORIDE 0.9 % IV SOLN
Freq: Once | INTRAVENOUS | Status: AC
  Administered 2015-07-29: 10:00:00 via INTRAVENOUS

## 2015-07-29 NOTE — Plan of Care (Signed)
Pt rec'd unit ofPRBC for Hgb of 7.2.  Hgb after 1 unit was 8.7 and Hct was 27.1.  Called Dr. Amado Coe and let her know the results and she sd we didn't have to give 2nd unit which was ordered.  Pt had dietary consult for new diag of celiac disease.  Now on gluten free diet.  Pt will be diligent abt maintaining her diet. Continues to be on solumedrol.  Getting double portions of meals.

## 2015-07-29 NOTE — Progress Notes (Signed)
Report given to [facility]. Henriette Combs RN

## 2015-07-29 NOTE — Progress Notes (Signed)
Initial Nutrition Assessment  DOCUMENTATION CODES:   Non-severe (moderate) malnutrition in context of acute illness/injury  INTERVENTION:  Meals and snacks: Monitor intake  Nutrition diet education: Discussed gluten free diet with pt and mother.  Materials provided. Questions addressed. Expect good compliance. Teachback method used.   NUTRITION DIAGNOSIS:   Inadequate oral intake related to altered GI function as evidenced by per patient/family report.    GOAL:   Patient will meet greater than or equal to 90% of their needs    MONITOR:    (Energy intake, Digestive system)  REASON FOR ASSESSMENT:   Consult Diet education  ASSESSMENT:      Pt admitted with ulcerative colitis and celiac disease  Past Medical History  Diagnosis Date  . UC (ulcerative colitis) (HCC)   . Celiac disease     Current Nutrition: tolerating gluten free diet, eating 100% secondary to steroids  Food/Nutrition-Related History: decreased intake prior to admission secondary to abdominal pain, diarrhea for at least the last 2 weeks.   Scheduled Medications:  . docusate sodium  100 mg Oral BID  . methylPREDNISolone (SOLU-MEDROL) injection  20 mg Intravenous 3 times per day  . sodium chloride flush  3 mL Intravenous Q12H    Continuous Medications:  . sodium chloride 100 mL/hr at 07/29/15 0521     Electrolyte/Renal Profile and Glucose Profile:   Recent Labs Lab 07/26/15 1828 07/27/15 0505  NA 137 137  K 4.3 4.1  CL 105 105  CO2 29 28  BUN 10 10  CREATININE 0.58 0.57  CALCIUM 8.0* 7.8*  GLUCOSE 103* 87   Protein Profile:   Recent Labs Lab 07/26/15 1828  ALBUMIN 2.3*    Gastrointestinal Profile: Last BM:1/30   Nutrition-Focused Physical Exam Findings: Nutrition-Focused physical exam completed. Findings are mild fat depletion, mild muscle depletion, and none edema.      Weight Change: pt reports wt loss from 122 pounds in December to 115 pounds, but has increased  wt prior to admission    Diet Order:  DIET SOFT Room service appropriate?: Yes; Fluid consistency:: Thin  Skin:   reviewed   Height:   Ht Readings from Last 1 Encounters:  07/26/15  (1.651 m)    Weight:   Wt Readings from Last 1 Encounters:  07/29/15 123 lb 3.2 oz (55.883 kg)    Ideal Body Weight:     BMI:  Body mass index is 20.5 kg/(m^2).  Estimated Nutritional Needs:   Kcal:  BEE 1330 kcals (IF 1.0-1.2, AF 1.3) 1610-9604 kcals/d  Protein:  (1.0-1.2 g/kg) 65-78 g/kg  Fluid:  (25-59ml/kg)1625-1950ml/d  EDUCATION NEEDS:   No education needs identified at this time  MODERATE Care Level  Julie Bentley, RD, LDN [phone] (pager) Weekend/On-Call pager [phone])

## 2015-07-29 NOTE — Progress Notes (Signed)
[place] at [place] NAME: Julie Bentley    MR#:  [id number]  DATE OF BIRTH:  1995-09-18  SUBJECTIVE:  CHIEF COMPLAINT:  Patient reports stool with blood streaks, relatively decreased blood in her stool  REVIEW OF SYSTEMS:  CONSTITUTIONAL: No fever, fatigue or weakness.  EYES: No blurred or double vision.  EARS, NOSE, AND THROAT: No tinnitus or ear pain.  RESPIRATORY: No cough, shortness of breath, wheezing or hemoptysis.  CARDIOVASCULAR: No chest pain, orthopnea, edema.  GASTROINTESTINAL: No nausea, vomiting, reporting less bloody diarrhea and few abdominal cramps prior to bowel movements  GENITOURINARY: No dysuria, hematuria.  ENDOCRINE: No polyuria, nocturia,  HEMATOLOGY: No anemia, easy bruising or bleeding SKIN: No rash or lesion. MUSCULOSKELETAL: No joint pain or arthritis.   NEUROLOGIC: No tingling, numbness, weakness.  PSYCHIATRY: No anxiety or depression.   DRUG ALLERGIES:  No Known Allergies  VITALS:  Blood pressure 121/72, pulse 81, temperature 97.7 F (36.5 C), temperature source Oral, resp. rate 22, height 5' 5"  (1.651 m), weight 55.883 kg (123 lb 3.2 oz), SpO2 100 %.  PHYSICAL EXAMINATION:  GENERAL:  20 y.o.-year-old patient lying in the bed with no acute distress.  EYES: Pupils equal, round, reactive to light and accommodation. No scleral icterus. Extraocular muscles intact.  HEENT: Head atraumatic, normocephalic. Oropharynx and nasopharynx clear.  NECK:  Supple, no jugular venous distention. No thyroid enlargement, no tenderness.  LUNGS: Normal breath sounds bilaterally, no wheezing, rales,rhonchi or crepitation. No use of accessory muscles of respiration.  CARDIOVASCULAR: S1, S2 normal. No murmurs, rubs, or gallops.  ABDOMEN: Soft, nontender, nondistended. Bowel sounds present. No organomegaly or mass.  EXTREMITIES: No pedal edema, cyanosis, or clubbing.  NEUROLOGIC: Cranial nerves II through XII are intact.  Muscle strength 5/5 in all extremities. Sensation intact. Gait not checked.  PSYCHIATRIC: The patient is alert and oriented x 3.  SKIN: No obvious rash, lesion, or ulcer.    LABORATORY PANEL:   CBC  Recent Labs Lab 07/29/15 0521 07/29/15 1604  WBC 13.5*  --   HGB 7.2* 8.7*  HCT 22.8* 27.1*  PLT 539*  --    ------------------------------------------------------------------------------------------------------------------  Chemistries   Recent Labs Lab 07/26/15 1828 07/27/15 0505  NA 137 137  K 4.3 4.1  CL 105 105  CO2 29 28  GLUCOSE 103* 87  BUN 10 10  CREATININE 0.58 0.57  CALCIUM 8.0* 7.8*  AST 19  --   ALT 23  --   ALKPHOS 52  --   BILITOT 0.1*  --    ------------------------------------------------------------------------------------------------------------------  Cardiac Enzymes No results for input(s): TROPONINI in the last 168 hours. ------------------------------------------------------------------------------------------------------------------  RADIOLOGY:  No results found.  EKG:   Orders placed or performed in visit on 03/21/14  . EKG 12-Lead    ASSESSMENT AND PLAN:   20 year old female with a recent diagnosis of possible ulcerative colitis and/or celiac disease, now being admitted for severe anemia  * Severe anemia: with reported hemoglobin of 4.9, l2/2 acute blood loss secondary to severe pan ulcerative colitis and newly diagnosed celiac disease Hemoglobin 4.4-7.4 --> 8.0-->7.2-->8.7 after I  PRBC today  Status post 3 units of packed red blood cell transfusion. Monitor hemoglobin and hematocrit closely and transfuse as needed to keep hemoglobin greater than 7 Appreciate GI recommendations, feeling better with  IV steroids a We will do discharge patient if hemoglobin is stable at or greater than 7 and bowel movements are less than 2-3 per day  Apply for  outpatient Humuira  * Ulcerative colitis and/or celiac disease:  Solumedrol 20 iv q 8  hrs  * Weakness: Likely from severe anemia  * Diarrhea and blood in the stool: started iv solumedrol       All the records are reviewed and case discussed with Care Management/Social Workerr. Management plans discussed with the patient, family and they are in agreement.  CODE STATUS:   TOTAL TIME TAKING CARE OF THIS PATIENT: 48mnutes.   POSSIBLE D/C IN 1-2 DAYS, DEPENDING ON CLINICAL CONDITION.   GNicholes MangoM.D on 07/29/2015 at 6:40 PM  Between 7am to 6pm - Pager - [phone] 6pm go to [email or web address] - password [email or web address]ospital[facility]  Office  [phone] CC: Primary care physician; DMe[place]

## 2015-07-29 NOTE — Progress Notes (Signed)
2029 Pt given 20 mg IVP Solumedrol.  Pt asked name of drug and I mistakenly said Decadron.  I reviewed medications and then told pt I gave her Solumedrol and I said the wrong name for the drug.  Pt's mother on the phone and asked to speak to me.  Explained that pt was given 20 mg IVP Solumedrol.  Mother wanted to know what the "D" drug was and I explained that I made a mistake in saying that it was Decadron when I entered the room but gave the patient Solumedrol.  Mother upset and states that pt is not to get more than 20 mg of Solumedrol a day and that I have now doubled her dose.  I explained the order and that I gave 20 mg IVP as ordered.  Mother wanted me to give her Dr Teddy Spike number directly and I stated that is not the hospital policy but if she would give me her number I would call the on-call GI doctor and ask him to call her.  Mother refused and asked to speak to Charge nurse.  I asked if I could say something and mother said no.  Asked charge nurse to speak with mother. Asked pt if I could draw her lab and she said no. Henriette Combs RN

## 2015-07-30 LAB — HEMOGLOBIN AND HEMATOCRIT, BLOOD
HCT: 27.5 % — ABNORMAL LOW (ref 35.0–47.0)
Hemoglobin: 8.6 g/dL — ABNORMAL LOW (ref 12.0–16.0)

## 2015-07-30 LAB — TYPE AND SCREEN
ABO/RH(D): O POS
Antibody Screen: NEGATIVE
UNIT DIVISION: 0
Unit division: 0
Unit division: 0
Unit division: 0

## 2015-07-30 LAB — CBC
HEMATOCRIT: 25.7 % — AB (ref 35.0–47.0)
HEMOGLOBIN: 8.5 g/dL — AB (ref 12.0–16.0)
MCH: 26.6 pg (ref 26.0–34.0)
MCHC: 33 g/dL (ref 32.0–36.0)
MCV: 80.7 fL (ref 80.0–100.0)
PLATELETS: 573 10*3/uL — AB (ref 150–440)
RBC: 3.19 MIL/uL — AB (ref 3.80–5.20)
RDW: 17.5 % — ABNORMAL HIGH (ref 11.5–14.5)
WBC: 14.2 10*3/uL — AB (ref 3.6–11.0)

## 2015-07-30 LAB — GLUCOSE, CAPILLARY: Glucose-Capillary: 118 mg/dL — ABNORMAL HIGH (ref 65–99)

## 2015-07-30 MED ORDER — FERROUS SULFATE 325 (65 FE) MG PO TABS: 325.0000 mg | ORAL_TABLET | Freq: Two times a day (BID) | ORAL | Status: DC

## 2015-07-30 MED ORDER — PREDNISONE 10 MG PO TABS: ORAL_TABLET | ORAL | Status: DC

## 2015-07-30 MED ORDER — DICYCLOMINE HCL 20 MG PO TABS: 20.0000 mg | ORAL_TABLET | Freq: Three times a day (TID) | ORAL | Status: DC | PRN

## 2015-07-30 MED ORDER — ACETAMINOPHEN 325 MG PO TABS: 650.0000 mg | ORAL_TABLET | Freq: Four times a day (QID) | ORAL | Status: DC | PRN

## 2015-07-30 MED ORDER — PREDNISONE 20 MG PO TABS
40.0000 mg | ORAL_TABLET | Freq: Every day | ORAL | Status: DC
  Administered 2015-07-30: 40 mg via ORAL
  Filled 2015-07-30: qty 2

## 2015-07-30 MED ORDER — ALUM & MAG HYDROXIDE-SIMETH 200-200-20 MG/5ML PO SUSP: 30.0000 mL | Freq: Four times a day (QID) | ORAL | Status: DC | PRN

## 2015-07-30 MED ORDER — HYDROCODONE-ACETAMINOPHEN 5-325 MG PO TABS: 1.0000 | ORAL_TABLET | ORAL | Status: DC | PRN

## 2015-07-30 MED ORDER — ENSURE ENLIVE PO LIQD: 237.0000 mL | Freq: Two times a day (BID) | ORAL | Status: DC

## 2015-07-30 NOTE — Progress Notes (Signed)
GI NOte:  Doing much better today.  Only one stool in 24 hours with min bleeding, No n/v, no abd pain.  No f/c.   Exam: A and o x 4, nad Cta, no w/c Rrr, no m/r/g Soft, nt,nd,nabs  Lab Results  Component Value Date   WBC 13.5* 07/29/2015   HGB 8.6* 07/29/2015   HCT 27.5* 07/29/2015   MCV 79.1* 07/29/2015   PLT 539* 07/29/2015    A/P: - change to prednisone 40 mg daily PO - d/c on Pred taper 40 mg x 7 days, 30 mg x 7 days, then hold at 20 mg until Humira started.  - likely d/c  1/31 or 2/1

## 2015-07-30 NOTE — Progress Notes (Signed)
Report received by Dorna Bloom RN. Introduced myself to patient (who was on the phone with her mother). Explained I will be caring for patient until 0700am. I explained to patient Solu-medrol 108m is ordered for 0600am (which is scheduled every 8 hours). Patient states she thought Dr. RRayann Hemanwas going to start a prednisone taper. I stated he currently has ordered her solu-medrol 20 mg 3 times per day but may make changes when he evaluates her on 07/30/15. Patient verbalized understanding of currents orders and would like to take 0600am dose. Will continue to assess.

## 2015-07-30 NOTE — Discharge Instructions (Signed)
Activity - as tolerated  Diet- gluten-free diet as tolerated Follow-up with primary care physician in a week Follow-up with gastroenterology in 2 weeks May return to school on February 2

## 2015-07-30 NOTE — Progress Notes (Signed)
[place], [place].   07/30/2015  Patient: Julie Bentley   Date of Birth:  1995-08-13  Date of admission:  07/26/2015  Date of Discharge  07/30/2015    To Whom it May Concern:   Cerenity Goshorn  may return to school on 08/01/15.  PHYSICAL ACTIVITY:  Full  If you have any questions or concerns, please don't hesitate to call.  Sincerely,   Nicholes Mango M.D Pager [phone] Office : [phone]   .

## 2015-07-30 NOTE — Plan of Care (Signed)
Problem: Bowel/Gastric: Goal: Will show no signs and symptoms of gastrointestinal bleeding Outcome: Progressing No BM's or s/s of bleeding over night. Hgb 8.6 Hct 27.5

## 2015-07-30 NOTE — Discharge Summary (Signed)
[facility] [place] NAME: Julie Bentley    MR#:  [id number]  DATE OF BIRTH:  1995/09/02  DATE OF ADMISSION:  07/26/2015 ADMITTING PHYSICIAN: Max Sane, MD  DATE OF DISCHARGE: 07/30/2015  PRIMARY CARE PHYSICIAN: [facility]    ADMISSION DIAGNOSIS:  Symptomatic Anemia  DISCHARGE DIAGNOSIS:  Acute pancolitis Symptomatically anemia Celiac disease-  SECONDARY DIAGNOSIS:   Past Medical History  Diagnosis Date  . UC (ulcerative colitis) ([place])   . Celiac disease     HOSPITAL COURSE:   20 year old female with a recent diagnosis of possible ulcerative colitis and/or celiac disease, now being admitted for severe anemia  * Severe anemia: with reported hemoglobin of 4.9, l2/2 acute blood loss secondary to severe pan ulcerative colitis and newly diagnosed celiac disease Hemoglobin 4.4-7.4 --> 8.0-->7.2-->8.7 after I PRBC today  Status post 3 units of packed red blood cell transfusion. Monitored hemoglobin and hematocrit closely and transfused as needed to keep hemoglobin greater than 7 Appreciate GI recommendations, today pt is  feeling better with IV steroids and plan is to d/c pt with PO steroids  * Ulcerative colitis and/or celiac disease:   Solumedrol 20 iv q 8 hrs was given and clinical condition improved  BM more formed and with less blood D/c home with po steroids and recommended gluten free diet Op f/u with GI   * Weakness: Likely from severe anemia IRON supplements   DISCHARGE CONDITIONS:   Fair  CONSULTS OBTAINED:  Treatment Team:  Josefine Class, MD   PROCEDURES none  DRUG ALLERGIES:  No Known Allergies  DISCHARGE MEDICATIONS:   Current Discharge Medication List    START taking these medications   Details  acetaminophen (TYLENOL) 325 MG tablet Take 2 tablets (650 mg total) by mouth every 6 (six) hours as needed for mild pain (or Fever >/= 101).    alum & mag hydroxide-simeth  (MAALOX/MYLANTA) 200-200-20 MG/5ML suspension Take 30 mLs by mouth every 6 (six) hours as needed for indigestion or heartburn (dyspepsia). Qty: 355 mL, Refills: 0    feeding supplement, ENSURE ENLIVE, (ENSURE ENLIVE) LIQD Take 237 mLs by mouth 2 (two) times daily between meals. Qty: 237 mL, Refills: 12    ferrous sulfate (FERROUSUL) 325 (65 FE) MG tablet Take 1 tablet (325 mg total) by mouth 2 (two) times daily with a meal. Qty: 60 tablet, Refills: 3    HYDROcodone-acetaminophen (NORCO/VICODIN) 5-325 MG tablet Take 1-2 tablets by mouth every 4 (four) hours as needed for moderate pain. Qty: 30 tablet, Refills: 0      CONTINUE these medications which have CHANGED   Details  dicyclomine (BENTYL) 20 MG tablet Take 1 tablet (20 mg total) by mouth 3 (three) times daily as needed for spasms. Qty: 90 tablet, Refills: 0    predniSONE (DELTASONE) 10 MG tablet 40 mg x 7 days; then 30 mg x 7 days; then 20 mg x daily Qty: 100 tablet, Refills: 0      CONTINUE these medications which have NOT CHANGED   Details  cetirizine (ZYRTEC) 10 MG tablet Take 1 tablet by mouth daily.         DISCHARGE INSTRUCTIONS:   Activity - as tolerated  Diet- gluten-free diet as tolerated Follow-up with primary care physician in a week Follow-up with gastroenterology in 2 weeks May return to school on February 2   DIET:  Gluten free diet  DISCHARGE CONDITION:  fair  ACTIVITY:  As tolerated  OXYGEN:  Home Oxygen: no   Oxygen Delivery: none  DISCHARGE LOCATION:  home  If you experience worsening of your admission symptoms, develop shortness of breath, life threatening emergency, suicidal or homicidal thoughts you must seek medical attention immediately by calling 911 or calling your MD immediately  if symptoms less severe.  You Must read complete instructions/literature along with all the possible adverse reactions/side effects for all the Medicines you take and that have been prescribed to you.  Take any new Medicines after you have completely understood and accpet all the possible adverse reactions/side effects.   Please note  You were cared for by a hospitalist during your hospital stay. If you have any questions about your discharge medications or the care you received while you were in the hospital after you are discharged, you can call the unit and asked to speak with the hospitalist on call if the hospitalist that took care of you is not available. Once you are discharged, your primary care physician will handle any further medical issues. Please note that NO REFILLS for any discharge medications will be authorized once you are discharged, as it is imperative that you return to your primary care physician (or establish a relationship with a primary care physician if you do not have one) for your aftercare needs so that they can reassess your need for medications and monitor your lab values.     Today  No chief complaint on file.  Pt is doing fine. Stool are formed with minimal blood, less frequent, wants to go home  ROS:  CONSTITUTIONAL: Denies fevers, chills. Denies any fatigue, weakness.  EYES: Denies blurry vision, double vision, eye pain. EARS, NOSE, THROAT: Denies tinnitus, ear pain, hearing loss. RESPIRATORY: Denies cough, wheeze, shortness of breath.  CARDIOVASCULAR: Denies chest pain, palpitations, edema.  GASTROINTESTINAL: Denies nausea, vomiting, diarrhea, abdominal pain. Denies bright red blood per rectum. GENITOURINARY: Denies dysuria, hematuria. ENDOCRINE: Denies nocturia or thyroid problems. HEMATOLOGIC AND LYMPHATIC: Denies easy bruising or bleeding. SKIN: Denies rash or lesion. MUSCULOSKELETAL: Denies pain in neck, back, shoulder, knees, hips or arthritic symptoms.  NEUROLOGIC: Denies paralysis, paresthesias.  PSYCHIATRIC: Denies anxiety or depressive symptoms.   VITAL SIGNS:  Blood pressure 119/75, pulse 81, temperature 98.2 F (36.8 C), temperature  source Oral, resp. rate 19, height 5' 5"  (1.651 m), weight 55.339 kg (122 lb), SpO2 100 %.  I/O:    Intake/Output Summary (Last 24 hours) at 07/30/15 1036 Last data filed at 07/30/15 0900  Gross per 24 hour  Intake    743 ml  Output      0 ml  Net    743 ml    PHYSICAL EXAMINATION:  GENERAL:  20 y.o.-year-old patient lying in the bed with no acute distress.  EYES: Pupils equal, round, reactive to light and accommodation. No scleral icterus. Extraocular muscles intact.  HEENT: Head atraumatic, normocephalic. Oropharynx and nasopharynx clear.  NECK:  Supple, no jugular venous distention. No thyroid enlargement, no tenderness.  LUNGS: Normal breath sounds bilaterally, no wheezing, rales,rhonchi or crepitation. No use of accessory muscles of respiration.  CARDIOVASCULAR: S1, S2 normal. No murmurs, rubs, or gallops.  ABDOMEN: Soft, non-tender, non-distended. Bowel sounds present. No organomegaly or mass.  EXTREMITIES: No pedal edema, cyanosis, or clubbing.  NEUROLOGIC: Cranial nerves II through XII are intact. Muscle strength 5/5 in all extremities. Sensation intact. Gait not checked.  PSYCHIATRIC: The patient is alert and oriented x 3.  SKIN: No obvious rash, lesion, or ulcer.   DATA REVIEW:  CBC  Recent Labs Lab 07/30/15 0521  WBC 14.2*  HGB 8.5*  HCT 25.7*  PLT 573*    Chemistries   Recent Labs Lab 07/26/15 1828 07/27/15 0505  NA 137 137  K 4.3 4.1  CL 105 105  CO2 29 28  GLUCOSE 103* 87  BUN 10 10  CREATININE 0.58 0.57  CALCIUM 8.0* 7.8*  AST 19  --   ALT 23  --   ALKPHOS 52  --   BILITOT 0.1*  --     Cardiac Enzymes No results for input(s): TROPONINI in the last 168 hours.  Microbiology Results  Results for orders placed or performed in visit on 12/01/12  Wet prep, genital     Status: None   Collection Time: 12/01/12  1:10 AM  Result Value Ref Range Status   Micro Text Report   Final       COMMENT                   RARE WHITE BLOOD CELLS SEEN    COMMENT                   CLUE CELLS SEEN   COMMENT                   NO TRICHOMONAS SEEN   COMMENT                   NO YEAST SEEN   COMMENT                   NO SPERMATOZOA SEEN   ANTIBIOTIC                                                      GC/Chlamydia Probe Amp     Status: None   Collection Time: 12/01/12 11:00 AM  Result Value Ref Range Status   Micro Text Report   Final       CHLAMYDIA                 CHLAMYDIA TRACHOMATIS NEGATIVE   N.GONORRHOEAE             N.GONORRHOEAE NEGATIVE   ANTIBIOTIC                                                        RADIOLOGY:  Dg Chest 1 View  07/26/2015  CLINICAL DATA:  Status post PICC line placement EXAM: CHEST 1 VIEW COMPARISON:  9/23/fifth FINDINGS: Cardiac shadow is within normal limits. The lungs are clear bilaterally. A new right-sided PICC line is noted at the cavoatrial junction in satisfactory position. No bony abnormality is seen. IMPRESSION: PICC line in satisfactory position. Electronically Signed   By: Inez Catalina M.D.   On: 07/26/2015 18:15    EKG:   Orders placed or performed in visit on 03/21/14  . EKG 12-Lead      Management plans discussed with the patient, family and they are in agreement.  CODE STATUS:     Code Status Orders        Start     Ordered   07/26/15 1326  Full code   Continuous     07/26/15 1325    Code Status History    Date Active Date Inactive Code Status Order ID Comments User Context   This patient has a current code status but no historical code status.      TOTAL TIME TAKING CARE OF THIS PATIENT: 45  minutes.    @[facility] @  on 07/30/2015 at 10:36 AM  Between 7am to 6pm - Pager - [phone]  After 6pm go to [email or web address] - password [email or web address] [facility]  [facility]  Office  [phone]  CC: Primary care physician; [place]

## 2015-07-30 NOTE — Plan of Care (Signed)
Pt's Hgb is 8.5; has been stable since transfusion yesterday of 1 PRBC.  D/ced home - will be on prednisone taper.  PICC line removed by charge nurse.  D/c instructions and scripts reviewed by charge nurse.  F/u appt made w/Dr. Shelle Iron.  Pt leaving via wheelchair w/mother.

## 2015-07-31 LAB — PATHOLOGIST SMEAR REVIEW

## 2016-02-20 ENCOUNTER — Emergency Department
Admission: EM | Admit: 2016-02-20 | Discharge: 2016-02-20 | Disposition: A | Discharge status: Home / Self Care | Payer: Medicaid Other | Attending: Emergency Medicine | Admitting: Emergency Medicine

## 2016-02-20 ENCOUNTER — Encounter: Payer: Self-pay | Admitting: Emergency Medicine

## 2016-02-20 DIAGNOSIS — B309 Viral conjunctivitis, unspecified: Secondary | ICD-10-CM | POA: Insufficient documentation

## 2016-02-20 DIAGNOSIS — Z760 Encounter for issue of repeat prescription: Secondary | ICD-10-CM | POA: Insufficient documentation

## 2016-02-20 LAB — COMPREHENSIVE METABOLIC PANEL
ALBUMIN: 4.2 g/dL (ref 3.5–5.0)
ALK PHOS: 48 U/L (ref 38–126)
ALT: 38 U/L (ref 14–54)
ANION GAP: 5 (ref 5–15)
AST: 33 U/L (ref 15–41)
BUN: 9 mg/dL (ref 6–20)
CO2: 26 mmol/L (ref 22–32)
Calcium: 8.7 mg/dL — ABNORMAL LOW (ref 8.9–10.3)
Chloride: 107 mmol/L (ref 101–111)
Creatinine, Ser: 0.5 mg/dL (ref 0.44–1.00)
GFR calc Af Amer: >60 mL/min (ref >60)
GFR calc non Af Amer: >60 mL/min (ref >60)
GLUCOSE: 88 mg/dL (ref 65–99)
POTASSIUM: 3.8 mmol/L (ref 3.5–5.1)
SODIUM: 138 mmol/L (ref 135–145)
Total Protein: 7.8 g/dL (ref 6.5–8.1)

## 2016-02-20 LAB — CBC WITH DIFFERENTIAL/PLATELET
Basophils Absolute: 0 10*3/uL (ref 0–0.1)
Basophils Relative: 0 %
EOS PCT: 5 %
Eosinophils Absolute: 0.3 10*3/uL (ref 0–0.7)
HEMATOCRIT: 35.7 % (ref 35.0–47.0)
Hemoglobin: 12.4 g/dL (ref 12.0–16.0)
LYMPHS PCT: 20 %
Lymphs Abs: 1.2 10*3/uL (ref 1.0–3.6)
MCH: 29.4 pg (ref 26.0–34.0)
MCHC: 34.8 g/dL (ref 32.0–36.0)
MCV: 84.3 fL (ref 80.0–100.0)
MONO ABS: 0.7 10*3/uL (ref 0.2–0.9)
MONOS PCT: 11 %
NEUTROS ABS: 3.7 10*3/uL (ref 1.4–6.5)
Neutrophils Relative %: 64 %
PLATELETS: 283 10*3/uL (ref 150–440)
RBC: 4.23 MIL/uL (ref 3.80–5.20)
RDW: 14.8 % — AB (ref 11.5–14.5)
WBC: 6 10*3/uL (ref 3.6–11.0)

## 2016-02-20 LAB — POCT RAPID STREP A: Streptococcus, Group A Screen (Direct): NEGATIVE

## 2016-02-20 MED ORDER — ADALIMUMAB 40 MG/0.8ML ~~LOC~~ PSKT: 40.0000 mg | PREFILLED_SYRINGE | Freq: Once | SUBCUTANEOUS | 0 refills | Status: DC

## 2016-02-20 MED ORDER — OLOPATADINE HCL 0.2 % OP SOLN: 2.0000 [drp] | Freq: Every day | OPHTHALMIC | 0 refills | Status: DC

## 2016-02-20 MED ORDER — LORATADINE 10 MG PO TABS
10.0000 mg | ORAL_TABLET | Freq: Every day | ORAL | Status: DC
  Administered 2016-02-20: 10 mg via ORAL
  Filled 2016-02-20: qty 1

## 2016-02-20 NOTE — Discharge Instructions (Signed)
Please call and schedule an appointment with Dr. Mechele CollinElliott as soon as possible.

## 2016-02-20 NOTE — ED Notes (Signed)
See triage note  Presents with redness to right eye and a sore throat for about 2-3 days   No fever   Feels like throat is getting worse and is closing up  Throat is red  No pus note  Able to speak full sentences and swallow secretions.  Also has a rash to face

## 2016-02-20 NOTE — ED Provider Notes (Signed)
[facility] Emergency Department Provider Note   ____________________________________________   First MD Initiated Contact with Patient 02/20/16 1307     (approximate)  I have reviewed the triage vital signs and the nursing notes.   HISTORY  Chief Complaint Sore Throat and Conjunctivitis   HPI Julie Bentley is a 20 y.o. female who presents to the emergency department for multiple complaints. She states that for the past 3 days she has had some right eye irritation and a sore throat. She has not taken her Zyrtec in the past several days. She denies cough,runny nose, or fever. She also states that she has been out of her Humira for the past 2 months due to some insurance issues. She has noticed some facial "break out" similar to what she had experienced prior to her diagnoses of ulcerative colitis and celiac disease. Diagnosis was made in January and she has been doing well up until the time she lost her insurance and had to stop using her Humira. She states her last dose of Humira was in May. She denies any bloody stools. She reports 1 episode of diarrhea, but otherwise has had formed stools. She denies abdominal pain. She is continuing to follow her gluten-free diet. She reports having a good appetite and has not noticed any significant weight gain or loss.   Past Medical History:  Diagnosis Date  . Celiac disease   . UC (ulcerative colitis) [facility])     Patient Active Problem List   Diagnosis Date Noted  . Malnutrition of moderate degree 07/29/2015  . Anemia 07/26/2015  . UC (ulcerative colitis) ([facility]) 07/26/2015  . Celiac disease 07/26/2015    History reviewed. No pertinent surgical history.  Prior to Admission medications   Medication Sig Start Date End Date Taking? Authorizing Provider  acetaminophen (TYLENOL) 325 MG tablet Take 2 tablets (650 mg total) by mouth every 6 (six) hours as needed for mild pain (or Fever >/= 101). 07/30/15   Ramonita Lab, MD    Adalimumab (HUMIRA) 40 MG/0.8ML PSKT Inject 0.8 mLs (40 mg total) into the skin once. Repeat injection in 2 weeks. 02/20/16 02/20/16  Chinita Pester, FNP  alum & mag hydroxide-simeth (MAALOX/MYLANTA) 200-200-20 MG/5ML suspension Take 30 mLs by mouth every 6 (six) hours as needed for indigestion or heartburn (dyspepsia). 07/30/15   Ramonita Lab, MD  cetirizine (ZYRTEC) 10 MG tablet Take 1 tablet by mouth daily. 04/29/15   Historical Provider, MD  dicyclomine (BENTYL) 20 MG tablet Take 1 tablet (20 mg total) by mouth 3 (three) times daily as needed for spasms. 07/30/15 07/29/16  Ramonita Lab, MD  feeding supplement, ENSURE ENLIVE, (ENSURE ENLIVE) LIQD Take 237 mLs by mouth 2 (two) times daily between meals. 07/30/15   Ramonita Lab, MD  ferrous sulfate (FERROUSUL) 325 (65 FE) MG tablet Take 1 tablet (325 mg total) by mouth 2 (two) times daily with a meal. 07/30/15   Ramonita Lab, MD  HYDROcodone-acetaminophen (NORCO/VICODIN) 5-325 MG tablet Take 1-2 tablets by mouth every 4 (four) hours as needed for moderate pain. 07/30/15   Ramonita Lab, MD  Olopatadine HCl 0.2 % SOLN Apply 2 drops to eye daily. 02/20/16   Chinita Pester, FNP  predniSONE (DELTASONE) 10 MG tablet 40 mg x 7 days; then 30 mg x 7 days; then 20 mg x daily 07/30/15   Ramonita Lab, MD    Allergies Review of patient's allergies indicates no known allergies.  No family history on file.  Social History Social History  Substance Use Topics  . Smoking status: Never Smoker  . Smokeless tobacco: Never Used  . Alcohol use No    Review of Systems Constitutional: No fever/chills. Positive for fatigue. Eyes: No visual changes. ENT: No sore throat. Cardiovascular: Denies chest pain. Respiratory: Denies shortness of breath. Gastrointestinal: No abdominal pain.  No nausea, no vomiting.  Single episode of diarrhea since May.  No constipation. No melena. Genitourinary: Negative for dysuria. Musculoskeletal: Negative for back pain. Skin: Negative for  rash. Neurological: Negative for headaches, focal weakness or numbness. ____________________________________________   PHYSICAL EXAM:  VITAL SIGNS: ED Triage Vitals  Enc Vitals Group     BP 02/20/16 1136 109/66     Pulse Rate 02/20/16 1136 76     Resp 02/20/16 1136 16     Temp 02/20/16 1136 98.3 F (36.8 C)     Temp Source 02/20/16 1136 Oral     SpO2 02/20/16 1136 99 %     Weight 02/20/16 1137 125 lb (56.7 kg)     Height 02/20/16 1137 5' 5.5" (1.664 m)     Head Circumference --      Peak Flow --      Pain Score 02/20/16 1135 3     Pain Loc --      Pain Edu? --      Excl. in GC? --     Constitutional: Alert and oriented. Well appearing and in no acute distress. Eyes: Conjunctivae Mildly erythematous in the right eye, normal in the left. There is no discharge or drainage present. Head: Atraumatic. Nose: No congestion/rhinnorhea. Mouth/Throat: Mucous membranes are moist.  Oropharynx mildly erythematous, no tonsillar exudate noted.  Neck: No stridor.   Cardiovascular: Normal rate, regular rhythm. Grossly normal heart sounds.  Good peripheral circulation. Respiratory: Normal respiratory effort.  No retractions. Lungs CTAB. Gastrointestinal: Soft and nontender. No distention. No abdominal bruits. No CVA tenderness. Musculoskeletal: No lower extremity tenderness nor edema.  No joint effusions. Neurologic:  Normal speech and language. No gross focal neurologic deficits are appreciated. No gait instability. Skin:  Skin is warm, dry and intact. Flaky, rough, macular rash scattered over the face  Psychiatric: Mood and affect are normal. Speech and behavior are normal.  ____________________________________________   LABS (all labs ordered are listed, but only abnormal results are displayed)  Labs Reviewed  COMPREHENSIVE METABOLIC PANEL - Abnormal; Notable for the following:       Result Value   Calcium 8.7 (*)    Total Bilirubin <0.1 (*)    All other components within normal  limits  CBC WITH DIFFERENTIAL/PLATELET - Abnormal; Notable for the following:    RDW 14.8 (*)    All other components within normal limits  POCT RAPID STREP A   ____________________________________________  EKG   ____________________________________________  RADIOLOGY  Not indicated ____________________________________________   PROCEDURES  Procedure(s) performed: None  Procedures  Critical Care performed: No  ____________________________________________   INITIAL IMPRESSION / ASSESSMENT AND PLAN / ED COURSE  Pertinent labs & imaging results that were available during my care of the patient were reviewed by me and considered in my medical decision making (see chart for details).  Patient was strongly advised to follow-up with Dr. Mechele CollinElliott. She states that she has an appointment scheduled for October, but plans on calling to see if she can get in sooner. She was advised to continue taking her ferrous sulfate 325 mg daily. She will go to the medication management clinic and attempt to have her Humira filled. She was  advised to return to the emergency department for abdominal pain, return of rectal bleeding, or other concerns if she is unable to see gastroenterology.  Clinical Course     ____________________________________________   FINAL CLINICAL IMPRESSION(S) / ED DIAGNOSES  Final diagnoses:  Medication refill  Acute viral conjunctivitis of right eye      NEW MEDICATIONS STARTED DURING THIS VISIT:  Discharge Medication List as of 02/20/2016  3:22 PM    START taking these medications   Details  Olopatadine HCl 0.2 % SOLN Apply 2 drops to eye daily., Starting Thu 02/20/2016, Print         Note:  This document was prepared using Dragon voice recognition software and may include unintentional dictation errors.    Chinita Pester, FNP 02/20/16 1548    Phineas Semen, MD 02/20/16 1932

## 2016-02-20 NOTE — ED Triage Notes (Signed)
Patient presents to the ED with sore throat and right eye redness x 2 days along with a rash to her face x 1 week.  Patient reports history of celiac disease and colitis.  Patient has not been taking her humera x 2 months due to financial issues and lapse in insurance.  Patient states, "I think this rash might be related to my celiac, can I have my inflammatory markers checked?"  Patient is in no obvious distress at this time.

## 2016-02-23 ENCOUNTER — Encounter: Payer: Self-pay | Admitting: Emergency Medicine

## 2016-02-23 ENCOUNTER — Emergency Department
Admission: EM | Admit: 2016-02-23 | Discharge: 2016-02-23 | Disposition: A | Discharge status: Home / Self Care | Payer: Medicaid Other | Attending: Emergency Medicine | Admitting: Emergency Medicine

## 2016-02-23 DIAGNOSIS — Z791 Long term (current) use of non-steroidal anti-inflammatories (NSAID): Secondary | ICD-10-CM | POA: Insufficient documentation

## 2016-02-23 DIAGNOSIS — Z79899 Other long term (current) drug therapy: Secondary | ICD-10-CM | POA: Insufficient documentation

## 2016-02-23 DIAGNOSIS — L259 Unspecified contact dermatitis, unspecified cause: Secondary | ICD-10-CM | POA: Insufficient documentation

## 2016-02-23 MED ORDER — DEXAMETHASONE SODIUM PHOSPHATE 10 MG/ML IJ SOLN
INTRAMUSCULAR | Status: AC
  Administered 2016-02-23: 8 mg via INTRAMUSCULAR
  Filled 2016-02-23: qty 1

## 2016-02-23 MED ORDER — PREDNISONE 5 MG PO TABS: 30.0000 mg | ORAL_TABLET | Freq: Every day | ORAL | 0 refills | Status: AC

## 2016-02-23 MED ORDER — DEXAMETHASONE SODIUM PHOSPHATE 10 MG/ML IJ SOLN
8.0000 mg | Freq: Once | INTRAMUSCULAR | Status: AC
  Administered 2016-02-23: 8 mg via INTRAMUSCULAR

## 2016-02-23 MED ORDER — METHYLPREDNISOLONE SODIUM SUCC 40 MG IJ SOLR: 40.0000 mg | Freq: Once | INTRAMUSCULAR | Status: DC

## 2016-02-23 NOTE — ED Triage Notes (Signed)
C/O facial swelling and swelling to right inner thigh.  Patient states symptoms began about 1 week ago and was seen through ED for same complaints on Thursday.  Patient has ulcerative colitis and has been off of Humira for two months.    Facial swelling seen to left cheek and under left eye.  NAD.

## 2016-02-23 NOTE — ED Notes (Signed)
PT reports having had facial swelling.

## 2016-02-23 NOTE — ED Provider Notes (Signed)
[facility] Emergency Department Provider Note  ____________________________________________  Time seen: Approximately 11:03 AM  I have reviewed the triage vital signs and the nursing notes.   HISTORY  Chief Complaint Facial Swelling    HPI Julie Bentley is a 20 y.o. female , NAD, presents to the emergency department accompanied by her mother who assists with history. Has had a rash with itching and irritation for about a week. Began after wearing clothing that had detergent residue from a previous wash. States she wore a pair of undergarments that caused redness, itching and irritation about her inguinal area. Since that time has hand washed all of those items to diminish the detergent residue. Notes that she has noted similar rash began about the face, lip, forearms that feels itchy and irritated. Her mother states that the child had similar skin eruptions related to her ulcerative colitis and states the child has been off Humira for approximately 2 months. They are in the process of getting Humira reapproved by insurance as well as planning to follow up with Dr. Mechele [facility]lli[facility] gastroenterology for regular follow-up. At this time the patient does not have a primary care provider and is in the process of establishing with a new primary care [facility]. Patient denies any abdominal pain, nausea, vomiting, diarrhea, hematochezia. Has had no fatigue, joint swelling or joint pain. No fevers or chills. Was seen in this emergency department approximately 3 days ago for similar symptoms and was treated for conjunctivitis. She states that she did not fill the prescription for the eyedrops as they were too expensive but has been taking her Zyrtec daily which has alleviated her eye redness as well as sore throat that she complained of a few days ago.   Past Medical History:  Diagnosis Date  . Celiac disease   . UC (ulcerative colitis) [facility](HCC)     Patient Active Problem List   Diagnosis Date Noted  . Malnutrition of moderate degree 07/29/2015  . Anemia 07/26/2015  . UC (ulcerative colitis) (HCC) 07/26/2015  . Celiac disease 07/26/2015    History reviewed. No pertinent surgical history.  Prior to Admission medications   Medication Sig Start Date End Date Taking? Authorizing Provider  acetaminophen (TYLENOL) 325 MG tablet Take 2 tablets (650 mg total) by mouth every 6 (six) hours as needed for mild pain (or Fever >/= 101). 07/30/15   Ramonita LabAruna Gouru, MD  Adalimumab (HUMIRA) 40 MG/0.8ML PSKT Inject 0.8 mLs (40 mg total) into the skin once. Repeat injection in 2 weeks. 02/20/16 02/20/16  Chinita Pesterari B Triplett, FNP  alum & mag hydroxide-simeth (MAALOX/MYLANTA) 200-200-20 MG/5ML suspension Take 30 mLs by mouth every 6 (six) hours as needed for indigestion or heartburn (dyspepsia). 07/30/15   Ramonita LabAruna Gouru, MD  cetirizine (ZYRTEC) 10 MG tablet Take 1 tablet by mouth daily. 04/29/15   Historical Provider, MD  dicyclomine (BENTYL) 20 MG tablet Take 1 tablet (20 mg total) by mouth 3 (three) times daily as needed for spasms. 07/30/15 07/29/16  Ramonita LabAruna Gouru, MD  feeding supplement, ENSURE ENLIVE, (ENSURE ENLIVE) LIQD Take 237 mLs by mouth 2 (two) times daily between meals. 07/30/15   Ramonita LabAruna Gouru, MD  ferrous sulfate (FERROUSUL) 325 (65 FE) MG tablet Take 1 tablet (325 mg total) by mouth 2 (two) times daily with a meal. 07/30/15   Ramonita LabAruna Gouru, MD  HYDROcodone-acetaminophen (NORCO/VICODIN) 5-325 MG tablet Take 1-2 tablets by mouth every 4 (four) hours as needed for moderate pain. 07/30/15   Ramonita LabAruna Gouru, MD  Olopatadine HCl 0.2 %  SOLN Apply 2 drops to eye daily. 02/20/16   Chinita Pester, FNP  predniSONE (DELTASONE) 5 MG tablet Take 6 tablets (30 mg total) by mouth daily with breakfast. May take for up to 5 days.  Do not take any NSAIDs with this medication. Take with food. 02/23/16 02/28/16  Somtochukwu Woollard L Audra Bellard, PA-C    Allergies Review of patient's allergies indicates no known allergies.  No family history  on file.  Social History Social History  Substance Use Topics  . Smoking status: Never Smoker  . Smokeless tobacco: Never Used  . Alcohol use No     Review of Systems  Constitutional: No fever/chills, Fatigue Eyes: No visual changes. No discharge, Redness, pain ENT: No sore throat, nasal congestion, sinus pressure. Cardiovascular: No chest pain. Respiratory: No shortness of breath. No wheezing.  Gastrointestinal: No abdominal pain.  No nausea, vomiting.  No diarrhea, Hematochezia Musculoskeletal: Negative for general myalgias.  Skin: Positive for rash. Neurological: Negative for headaches, focal weakness or numbness. No tingling 10-point ROS otherwise negative.  ____________________________________________   PHYSICAL EXAM:  VITAL SIGNS: ED Triage Vitals  Enc Vitals Group     BP 02/23/16 1037 112/69     Pulse Rate 02/23/16 1037 95     Resp 02/23/16 1037 16     Temp 02/23/16 1037 98.3 F (36.8 C)     Temp Source 02/23/16 1037 Oral     SpO2 02/23/16 1037 97 %     Weight 02/23/16 1038 133 lb (60.3 kg)     Height 02/23/16 1038 5\' 5"  (1.651 m)     Head Circumference --      Peak Flow --      Pain Score 02/23/16 1038 0     Pain Loc --      Pain Edu? --      Excl. in GC? --      Constitutional: Alert and oriented. Well appearing and in no acute distress. Eyes: Conjunctivae are normal without icterus or injection bilaterally. PERRL. EOMI without pain.  Head: Atraumatic. Neck: No stridor. Supple with full range of motion. Hematological/Lymphatic/Immunilogical: No cervical lymphadenopathy. Cardiovascular: Normal rate, regular rhythm. Normal S1 and S2.  Good peripheral circulation. Respiratory: Normal respiratory effort without tachypnea or retractions. Lungs CTAB with breath sounds noted in all lung fields. No wheeze, LOC, rales Musculoskeletal: No lower extremity tenderness nor edema.  No joint effusions. Neurologic:  Normal speech and language. No gross focal  neurologic deficits are appreciated.  Skin:  Skin about the right side of the face, upper lip, bilateral inguinal areas, bilateral forearms with mild erythema and multiple papules. Patient notes that these areas itch and are irritated. Trace swelling is noted about the right side of the face. Skin is warm, dry and intact.  Psychiatric: Mood and affect are normal. Speech and behavior are normal. Patient exhibits appropriate insight and judgement.   ____________________________________________   LABS  None ____________________________________________  EKG  None ____________________________________________  RADIOLOGY  None ____________________________________________    PROCEDURES  Procedure(s) performed: None   Procedures   Medications  dexamethasone (DECADRON) injection 8 mg (8 mg Intramuscular Given 02/23/16 1144)     ____________________________________________   INITIAL IMPRESSION / ASSESSMENT AND PLAN / ED COURSE  Pertinent labs & imaging results that were available during my care of the patient were reviewed by me and considered in my medical decision making (see chart for details).  Clinical Course    Patient's diagnosis is consistent with Contact dermatitis. Patient will be discharged  home with prescriptions for prednisone to take as directed. Patient is to continue over-the-counter Zyrtec to take daily. Patient is to follow up with [facility] if symptoms persist past this treatment course. Patient is also advised to continue with follow-up with Dr. Mechele Collin in GI for management of ulcerative colitis. Patient is given ED precautions to return to the ED for any worsening or new symptoms.    ____________________________________________  FINAL CLINICAL IMPRESSION(S) / ED DIAGNOSES  Final diagnoses:  Contact dermatitis      NEW MEDICATIONS STARTED DURING THIS VISIT:  Discharge Medication List as of 02/23/2016 12:06 PM           Hope Pigeon, PA-C 02/23/16 1307    Arnaldo Natal, MD 02/23/16 1520

## 2016-08-18 ENCOUNTER — Emergency Department
Admission: EM | Admit: 2016-08-18 | Discharge: 2016-08-18 | Disposition: A | Discharge status: Home / Self Care | Payer: Medicaid Other | Attending: Emergency Medicine | Admitting: Emergency Medicine

## 2016-08-18 DIAGNOSIS — K922 Gastrointestinal hemorrhage, unspecified: Secondary | ICD-10-CM | POA: Insufficient documentation

## 2016-08-18 DIAGNOSIS — K529 Noninfective gastroenteritis and colitis, unspecified: Secondary | ICD-10-CM | POA: Insufficient documentation

## 2016-08-18 DIAGNOSIS — Z79899 Other long term (current) drug therapy: Secondary | ICD-10-CM | POA: Insufficient documentation

## 2016-08-18 LAB — COMPREHENSIVE METABOLIC PANEL
ALT: 12 U/L — ABNORMAL LOW (ref 14–54)
AST: 20 U/L (ref 15–41)
Albumin: 3.8 g/dL (ref 3.5–5.0)
Alkaline Phosphatase: 48 U/L (ref 38–126)
Anion gap: 7 (ref 5–15)
BILIRUBIN TOTAL: 0.5 mg/dL (ref 0.3–1.2)
BUN: 10 mg/dL (ref 6–20)
CO2: 29 mmol/L (ref 22–32)
Calcium: 8.8 mg/dL — ABNORMAL LOW (ref 8.9–10.3)
Chloride: 103 mmol/L (ref 101–111)
Creatinine, Ser: 0.67 mg/dL (ref 0.44–1.00)
Glucose, Bld: 93 mg/dL (ref 65–99)
POTASSIUM: 3.6 mmol/L (ref 3.5–5.1)
Sodium: 139 mmol/L (ref 135–145)
TOTAL PROTEIN: 7.7 g/dL (ref 6.5–8.1)

## 2016-08-18 LAB — CBC
HEMATOCRIT: 34.5 % — AB (ref 35.0–47.0)
Hemoglobin: 11.7 g/dL — ABNORMAL LOW (ref 12.0–16.0)
MCH: 29.6 pg (ref 26.0–34.0)
MCHC: 33.8 g/dL (ref 32.0–36.0)
MCV: 87.5 fL (ref 80.0–100.0)
PLATELETS: 452 10*3/uL — AB (ref 150–440)
RBC: 3.95 MIL/uL (ref 3.80–5.20)
RDW: 12.8 % (ref 11.5–14.5)
WBC: 5 10*3/uL (ref 3.6–11.0)

## 2016-08-18 LAB — TYPE AND SCREEN
ABO/RH(D): O POS
ANTIBODY SCREEN: NEGATIVE

## 2016-08-18 LAB — POCT PREGNANCY, URINE: Preg Test, Ur: NEGATIVE

## 2016-08-18 MED ORDER — PREDNISONE 20 MG PO TABS
60.0000 mg | ORAL_TABLET | Freq: Once | ORAL | Status: AC
  Administered 2016-08-18: 60 mg via ORAL
  Filled 2016-08-18: qty 3

## 2016-08-18 MED ORDER — DICYCLOMINE HCL 20 MG PO TABS: 20.0000 mg | ORAL_TABLET | Freq: Three times a day (TID) | ORAL | 0 refills | Status: DC | PRN

## 2016-08-18 MED ORDER — PREDNISONE 10 MG PO TABS: ORAL_TABLET | ORAL | 0 refills | Status: DC

## 2016-08-18 NOTE — Discharge Instructions (Signed)
Please seek medical attention for any high fevers, chest pain, shortness of breath, change in behavior, persistent vomiting, bloody stool or any other new or concerning symptoms.  

## 2016-08-18 NOTE — ED Triage Notes (Signed)
Pt c/o having intermittent loose stools recently and in the past few days having blood mixed with the stools.. Denies abd pain

## 2016-08-18 NOTE — ED Provider Notes (Signed)
[facility] Emergency Department Provider Note   ____________________________________________   I have reviewed the triage vital signs and the nursing notes.   HISTORY  Chief Complaint Rectal Bleeding   History limited by: Not Limited   HPI Julie Bentley is a 21 y.o. female who presents to the emergency department today because of concerns for gastrointestinal bleeding. Patient states she has a history of ulcerative colitis as well as celiac disease. Starting 3 days ago the patient first noticed bloody stool. She states she has had 4 episodes of bloody stool. It is a darker red color. The patient has had some associated headache and foot pain which she states has happened before when she has had flares of her colitis. The patient has not been on Humira for about 8 months secondary to insurance issues. No fevers.   Past Medical History:  Diagnosis Date  . Celiac disease   . UC (ulcerative colitis) [facility])     Patient Active Problem List   Diagnosis Date Noted  . Malnutrition of moderate degree 07/29/2015  . Anemia 07/26/2015  . UC (ulcerative colitis) (HCC) 07/26/2015  . Celiac disease 07/26/2015    History reviewed. No pertinent surgical history.  Prior to Admission medications   Medication Sig Start Date End Date Taking? Authorizing Provider  acetaminophen (TYLENOL) 325 MG tablet Take 2 tablets (650 mg total) by mouth every 6 (six) hours as needed for mild pain (or Fever >/= 101). 07/30/15   Ramonita Lab, MD  Adalimumab (HUMIRA) 40 MG/0.8ML PSKT Inject 0.8 mLs (40 mg total) into the skin once. Repeat injection in 2 weeks. 02/20/16 02/20/16  Chinita Pester, FNP  alum & mag hydroxide-simeth (MAALOX/MYLANTA) 200-200-20 MG/5ML suspension Take 30 mLs by mouth every 6 (six) hours as needed for indigestion or heartburn (dyspepsia). 07/30/15   Ramonita Lab, MD  cetirizine (ZYRTEC) 10 MG tablet Take 1 tablet by mouth daily. 04/29/15   Historical Provider, MD   dicyclomine (BENTYL) 20 MG tablet Take 1 tablet (20 mg total) by mouth 3 (three) times daily as needed for spasms. 08/18/16 08/18/17  Phineas Semen, MD  feeding supplement, ENSURE ENLIVE, (ENSURE ENLIVE) LIQD Take 237 mLs by mouth 2 (two) times daily between meals. 07/30/15   Ramonita Lab, MD  ferrous sulfate (FERROUSUL) 325 (65 FE) MG tablet Take 1 tablet (325 mg total) by mouth 2 (two) times daily with a meal. 07/30/15   Ramonita Lab, MD  HYDROcodone-acetaminophen (NORCO/VICODIN) 5-325 MG tablet Take 1-2 tablets by mouth every 4 (four) hours as needed for moderate pain. 07/30/15   Ramonita Lab, MD  Olopatadine HCl 0.2 % SOLN Apply 2 drops to eye daily. 02/20/16   Chinita Pester, FNP  predniSONE (DELTASONE) 10 MG tablet Days 1-5: Take 4 tablets (40 mg) daily Days 6-10: Take 3 tablets (30 mg) daily Days 11-15: Take 2 tablets (20 mg) daily Days 16-20: Take 1 tablets (10 mg) daily 08/18/16   Phineas Semen, MD    Allergies Patient has no known allergies.  No family history on file.  Social History Social History  Substance Use Topics  . Smoking status: Never Smoker  . Smokeless tobacco: Never Used  . Alcohol use No    Review of Systems  Constitutional: Negative for fever. Cardiovascular: Negative for chest pain. Respiratory: Negative for shortness of breath. Gastrointestinal: Positive for GI bleed.  Neurological: Negative for headaches, focal weakness or numbness.  10-point ROS otherwise negative.  ____________________________________________   PHYSICAL EXAM:  VITAL SIGNS: ED Triage Vitals [  08/18/16 1542]  Enc Vitals Group     BP 112/72     Pulse Rate 74     Resp 16     Temp 99 F (37.2 C)     Temp Source Oral     SpO2 100 %     Weight 125 lb (56.7 kg)     Height 5\' 5"  (1.651 m)     Head Circumference      Peak Flow      Pain Score 4    Constitutional: Alert and oriented. Well appearing and in no distress. Eyes: Conjunctivae are normal. Normal extraocular  movements. ENT   Head: Normocephalic and atraumatic.   Nose: No congestion/rhinnorhea.   Mouth/Throat: Mucous membranes are moist.   Neck: No stridor. Hematological/Lymphatic/Immunilogical: No cervical lymphadenopathy. Cardiovascular: Normal rate, regular rhythm.  No murmurs, rubs, or gallops.  Respiratory: Normal respiratory effort without tachypnea nor retractions. Breath sounds are clear and equal bilaterally. No wheezes/rales/rhonchi. Gastrointestinal: Soft and minimally tender somewhat diffusely. No rebound. No guarding.  Genitourinary: Deferred Musculoskeletal: Normal range of motion in all extremities.  Neurologic:  Normal speech and language. No gross focal neurologic deficits are appreciated.  Skin:  Skin is warm, dry and intact. No rash noted. Psychiatric: Mood and affect are normal. Speech and behavior are normal. Patient exhibits appropriate insight and judgment.  ____________________________________________    LABS (pertinent positives/negatives)  Labs Reviewed  COMPREHENSIVE METABOLIC PANEL - Abnormal; Notable for the following:       Result Value   Calcium 8.8 (*)    ALT 12 (*)    All other components within normal limits  CBC - Abnormal; Notable for the following:    Hemoglobin 11.7 (*)    HCT 34.5 (*)    Platelets 452 (*)    All other components within normal limits  POCT PREGNANCY, URINE  TYPE AND SCREEN     ____________________________________________   EKG  None  ____________________________________________    RADIOLOGY  None  ____________________________________________   PROCEDURES  Procedures  ____________________________________________   INITIAL IMPRESSION / ASSESSMENT AND PLAN / ED COURSE  Pertinent labs & imaging results that were available during my care of the patient were reviewed by me and considered in my medical decision making (see chart for details).  Patient presents to the emergency department today  with concerns for GI bleed in the setting of known ulcerative colitis and sinus disease. Hemoglobin today 11.7. This point feel patient is safe for outpatient workup. Will give patient a course of steroids. Additionally will give patient refill for Bentyl. While patient follow-up with her GI doctor.  ____________________________________________   FINAL CLINICAL IMPRESSION(S) / ED DIAGNOSES  Final diagnoses:  Gastrointestinal hemorrhage, unspecified gastrointestinal hemorrhage type  Colitis     Note: This dictation was prepared with Dragon dictation. Any transcriptional errors that result from this process are unintentional     Phineas SemenGraydon Desiree Daise, MD 08/18/16 1737

## 2016-08-18 NOTE — ED Notes (Signed)
Pt reports that she is having headaches for the past week and blood in stool since Saturday morning (4 total stools with blood in them) - the bleeding is dark red in color - pt denies abd pain, denies nausea, denies vomiting - pt has hx celiac disease and colitis

## 2017-09-21 ENCOUNTER — Other Ambulatory Visit
Admission: RE | Admit: 2017-09-21 | Discharge: 2017-09-21 | Disposition: A | Discharge status: Home / Self Care | Payer: Self-pay | Source: Ambulatory Visit | Attending: Gastroenterology | Admitting: Gastroenterology

## 2017-09-21 DIAGNOSIS — K51911 Ulcerative colitis, unspecified with rectal bleeding: Secondary | ICD-10-CM | POA: Insufficient documentation

## 2017-09-21 LAB — GASTROINTESTINAL PANEL BY PCR, STOOL (REPLACES STOOL CULTURE)
ADENOVIRUS F40/41: NOT DETECTED
ASTROVIRUS: NOT DETECTED
CAMPYLOBACTER SPECIES: NOT DETECTED
Cryptosporidium: NOT DETECTED
Cyclospora cayetanensis: NOT DETECTED
ENTAMOEBA HISTOLYTICA: NOT DETECTED
ENTEROPATHOGENIC E COLI (EPEC): NOT DETECTED
ENTEROTOXIGENIC E COLI (ETEC): NOT DETECTED
Enteroaggregative E coli (EAEC): NOT DETECTED
Giardia lamblia: NOT DETECTED
Norovirus GI/GII: NOT DETECTED
Plesimonas shigelloides: NOT DETECTED
Rotavirus A: NOT DETECTED
Salmonella species: NOT DETECTED
Sapovirus (I, II, IV, and V): NOT DETECTED
Shiga like toxin producing E coli (STEC): NOT DETECTED
Shigella/Enteroinvasive E coli (EIEC): NOT DETECTED
VIBRIO CHOLERAE: NOT DETECTED
Vibrio species: NOT DETECTED
Yersinia enterocolitica: NOT DETECTED

## 2017-09-21 LAB — C DIFFICILE QUICK SCREEN W PCR REFLEX
C DIFFICLE (CDIFF) ANTIGEN: NEGATIVE
C Diff interpretation: NOT DETECTED
C Diff toxin: NEGATIVE

## 2017-09-22 LAB — CALPROTECTIN, FECAL: Calprotectin, Fecal: 418 ug/g — ABNORMAL HIGH (ref 0–120)

## 2019-01-27 ENCOUNTER — Inpatient Hospital Stay: Payer: BC Managed Care – PPO

## 2019-01-27 ENCOUNTER — Encounter: Payer: Self-pay | Admitting: Emergency Medicine

## 2019-01-27 ENCOUNTER — Ambulatory Visit (LOCAL_COMMUNITY_HEALTH_CENTER): Payer: Medicaid Other

## 2019-01-27 ENCOUNTER — Other Ambulatory Visit: Payer: Self-pay

## 2019-01-27 ENCOUNTER — Inpatient Hospital Stay
Admission: EM | Admit: 2019-01-27 | Discharge: 2019-02-07 | DRG: 387 | Disposition: A | Discharge status: Home / Self Care | Payer: BC Managed Care – PPO | Attending: Internal Medicine | Admitting: Internal Medicine

## 2019-01-27 DIAGNOSIS — Z79899 Other long term (current) drug therapy: Secondary | ICD-10-CM | POA: Diagnosis not present

## 2019-01-27 DIAGNOSIS — K625 Hemorrhage of anus and rectum: Secondary | ICD-10-CM | POA: Diagnosis present

## 2019-01-27 DIAGNOSIS — D500 Iron deficiency anemia secondary to blood loss (chronic): Secondary | ICD-10-CM | POA: Diagnosis present

## 2019-01-27 DIAGNOSIS — Z800 Family history of malignant neoplasm of digestive organs: Secondary | ICD-10-CM

## 2019-01-27 DIAGNOSIS — K900 Celiac disease: Secondary | ICD-10-CM | POA: Diagnosis present

## 2019-01-27 DIAGNOSIS — Z20828 Contact with and (suspected) exposure to other viral communicable diseases: Secondary | ICD-10-CM | POA: Diagnosis present

## 2019-01-27 DIAGNOSIS — Z8249 Family history of ischemic heart disease and other diseases of the circulatory system: Secondary | ICD-10-CM | POA: Diagnosis not present

## 2019-01-27 DIAGNOSIS — K5190 Ulcerative colitis, unspecified, without complications: Secondary | ICD-10-CM | POA: Diagnosis present

## 2019-01-27 DIAGNOSIS — E876 Hypokalemia: Secondary | ICD-10-CM | POA: Diagnosis present

## 2019-01-27 DIAGNOSIS — K51911 Ulcerative colitis, unspecified with rectal bleeding: Secondary | ICD-10-CM

## 2019-01-27 DIAGNOSIS — K51011 Ulcerative (chronic) pancolitis with rectal bleeding: Secondary | ICD-10-CM | POA: Diagnosis present

## 2019-01-27 DIAGNOSIS — Z8774 Personal history of (corrected) congenital malformations of heart and circulatory system: Secondary | ICD-10-CM

## 2019-01-27 DIAGNOSIS — Z79891 Long term (current) use of opiate analgesic: Secondary | ICD-10-CM

## 2019-01-27 DIAGNOSIS — Z111 Encounter for screening for respiratory tuberculosis: Secondary | ICD-10-CM

## 2019-01-27 DIAGNOSIS — T380X5A Adverse effect of glucocorticoids and synthetic analogues, initial encounter: Secondary | ICD-10-CM | POA: Diagnosis present

## 2019-01-27 DIAGNOSIS — R0600 Dyspnea, unspecified: Secondary | ICD-10-CM | POA: Diagnosis not present

## 2019-01-27 LAB — GASTROINTESTINAL PANEL BY PCR, STOOL (REPLACES STOOL CULTURE)

## 2019-01-27 LAB — COMPREHENSIVE METABOLIC PANEL
ALT: 11 U/L (ref 0–44)
AST: 12 U/L — ABNORMAL LOW (ref 15–41)
Albumin: 3.5 g/dL (ref 3.5–5.0)
Alkaline Phosphatase: 39 U/L (ref 38–126)
Anion gap: 9 (ref 5–15)
BUN: 13 mg/dL (ref 6–20)
CO2: 28 mmol/L (ref 22–32)
Calcium: 8.9 mg/dL (ref 8.9–10.3)
Chloride: 102 mmol/L (ref 98–111)
Creatinine, Ser: 0.84 mg/dL (ref 0.44–1.00)
GFR calc Af Amer: >60 mL/min (ref >60)
GFR calc non Af Amer: >60 mL/min (ref >60)
Glucose, Bld: 80 mg/dL (ref 70–99)
Potassium: 3.1 mmol/L — ABNORMAL LOW (ref 3.5–5.1)
Sodium: 139 mmol/L (ref 135–145)
Total Bilirubin: 0.7 mg/dL (ref 0.3–1.2)
Total Protein: 6.7 g/dL (ref 6.5–8.1)

## 2019-01-27 LAB — C DIFFICILE QUICK SCREEN W PCR REFLEX
C Diff antigen: NEGATIVE
C Diff interpretation: NOT DETECTED
C Diff toxin: NEGATIVE

## 2019-01-27 LAB — HEMOGLOBIN AND HEMATOCRIT, BLOOD
HCT: 35.9 % — ABNORMAL LOW (ref 36.0–46.0)
Hemoglobin: 11.6 g/dL — ABNORMAL LOW (ref 12.0–15.0)

## 2019-01-27 LAB — TSH: TSH: 1.12 u[IU]/mL (ref 0.350–4.500)

## 2019-01-27 LAB — CBC
HCT: 34.4 % — ABNORMAL LOW (ref 36.0–46.0)
Hemoglobin: 11.2 g/dL — ABNORMAL LOW (ref 12.0–15.0)
MCH: 29.4 pg (ref 26.0–34.0)
MCHC: 32.6 g/dL (ref 30.0–36.0)
MCV: 90.3 fL (ref 80.0–100.0)
Platelets: 383 10*3/uL (ref 150–400)
RBC: 3.81 MIL/uL — ABNORMAL LOW (ref 3.87–5.11)
RDW: 13 % (ref 11.5–15.5)
WBC: 10 10*3/uL (ref 4.0–10.5)
nRBC: 0 % (ref 0.0–0.2)

## 2019-01-27 LAB — TYPE AND SCREEN
ABO/RH(D): O POS
Antibody Screen: NEGATIVE

## 2019-01-27 LAB — SARS CORONAVIRUS 2 BY RT PCR (HOSPITAL ORDER, PERFORMED IN ~~LOC~~ HOSPITAL LAB): SARS Coronavirus 2: NEGATIVE

## 2019-01-27 LAB — PREGNANCY, URINE: Preg Test, Ur: NEGATIVE

## 2019-01-27 MED ORDER — SODIUM CHLORIDE 0.9% FLUSH: 3.0000 mL | INTRAVENOUS | Status: DC | PRN

## 2019-01-27 MED ORDER — METHYLPREDNISOLONE SODIUM SUCC 40 MG IJ SOLR
40.0000 mg | Freq: Once | INTRAMUSCULAR | Status: AC
  Administered 2019-01-27: 40 mg via INTRAVENOUS
  Filled 2019-01-27: qty 1

## 2019-01-27 MED ORDER — ACETAMINOPHEN 650 MG RE SUPP: 650.0000 mg | Freq: Four times a day (QID) | RECTAL | Status: DC | PRN

## 2019-01-27 MED ORDER — METHYLPREDNISOLONE SODIUM SUCC 40 MG IJ SOLR
40.0000 mg | Freq: Two times a day (BID) | INTRAMUSCULAR | Status: DC
  Administered 2019-01-27 – 2019-01-30 (×6): 40 mg via INTRAVENOUS
  Filled 2019-01-27 (×6): qty 1

## 2019-01-27 MED ORDER — SODIUM CHLORIDE 0.9% FLUSH: 3.0000 mL | Freq: Two times a day (BID) | INTRAVENOUS | Status: DC

## 2019-01-27 MED ORDER — POTASSIUM CHLORIDE 20 MEQ PO PACK
40.0000 meq | PACK | Freq: Two times a day (BID) | ORAL | Status: DC
  Administered 2019-01-27: 23:00:00 40 meq via ORAL
  Filled 2019-01-27: qty 2

## 2019-01-27 MED ORDER — ACETAMINOPHEN 325 MG PO TABS
650.0000 mg | ORAL_TABLET | Freq: Four times a day (QID) | ORAL | Status: DC | PRN
  Administered 2019-02-07: 650 mg via ORAL
  Filled 2019-01-27: qty 2

## 2019-01-27 MED ORDER — IOHEXOL 300 MG/ML  SOLN
100.0000 mL | Freq: Once | INTRAMUSCULAR | Status: AC | PRN
  Administered 2019-01-27: 100 mL via INTRAVENOUS

## 2019-01-27 MED ORDER — SODIUM CHLORIDE 0.9 % IV SOLN
250.0000 mL | INTRAVENOUS | Status: DC | PRN
  Administered 2019-02-02: 16:00:00 30 mL via INTRAVENOUS

## 2019-01-27 MED ORDER — ONDANSETRON HCL 4 MG PO TABS: 4.0000 mg | ORAL_TABLET | Freq: Four times a day (QID) | ORAL | Status: DC | PRN

## 2019-01-27 MED ORDER — ONDANSETRON HCL 4 MG/2ML IJ SOLN: 4.0000 mg | Freq: Four times a day (QID) | INTRAMUSCULAR | Status: DC | PRN

## 2019-01-27 MED ORDER — LORATADINE 10 MG PO TABS
10.0000 mg | ORAL_TABLET | Freq: Every day | ORAL | Status: DC
  Administered 2019-01-27 – 2019-02-07 (×11): 10 mg via ORAL
  Filled 2019-01-27 (×11): qty 1

## 2019-01-27 MED ORDER — SODIUM CHLORIDE 0.9% FLUSH
3.0000 mL | Freq: Two times a day (BID) | INTRAVENOUS | Status: DC
  Administered 2019-01-27 – 2019-02-06 (×18): 3 mL via INTRAVENOUS

## 2019-01-27 MED ORDER — POTASSIUM CHLORIDE CRYS ER 20 MEQ PO TBCR
40.0000 meq | EXTENDED_RELEASE_TABLET | Freq: Two times a day (BID) | ORAL | Status: DC
  Administered 2019-01-28 – 2019-02-01 (×7): 40 meq via ORAL
  Filled 2019-01-27 (×10): qty 2

## 2019-01-27 NOTE — H&P (Signed)
[facility] [place] [facility]   PATIENT NAME: Julie Bentley    MR#:  [id number]  DATE OF BIRTH:  10-19-1995  DATE OF ADMISSION:  01/27/2019  PRIMARY CARE PHYSICIAN: [place], [facility]   REQUESTING/REFERRING PHYSICIAN: Willy EddyPatrick Robinson, MD  CHIEF COMPLAINT:   Chief Complaint  Patient presents with  . GI Bleeding    HISTORY OF PRESENT ILLNESS:  Julie Bentley  is a 23 y.o. female with a known history of ulcerative colitis and celiac disease.  She presents with a three-week history of persistent abdominal cramping and bloody diarrhea.  She is currently completing a prednisone taper initiated by Dr. Laural BenesJohnson, her gastroenterologist.  She is having 3-4 bloody stools a day with dark red blood and an increased amount of clots noticed as well.  She denies fevers or chills.  She denies weakness.  She denies chest pain or shortness of breath.  She denies nausea or vomiting.  She denies hematemesis.  She has been treated with Humira in the past.  However this was stopped 2 years ago when she was unable to continue affording the medication.  She is currently insured and in the process of having this restarted.  On arrival, hemoglobin is 11.2.  Hematocrit is 34.4.  Potassium is 3.1.  Rapid COVID-19 testing is negative.  C. difficile is negative.  We have admitted her to the hospital service for further management.  PAST MEDICAL HISTORY:   Past Medical History:  Diagnosis Date  . Celiac disease   . UC (ulcerative colitis) (HCC)     PAST SURGICAL HISTORY:  History reviewed. No pertinent surgical history.  SOCIAL HISTORY:   Social History   Tobacco Use  . Smoking status: Never Smoker  . Smokeless tobacco: Never Used  Substance Use Topics  . Alcohol use: No    FAMILY HISTORY:  History reviewed. No pertinent family history.  DRUG ALLERGIES:   Allergies  Allergen Reactions  . Gluten Meal     REVIEW OF SYSTEMS:   Review of Systems   Constitutional: Negative for chills, fever, malaise/fatigue and weight loss.  HENT: Negative for congestion, sinus pain and sore throat.   Eyes: Negative for blurred vision and double vision.  Respiratory: Negative for cough, hemoptysis, sputum production, shortness of breath and wheezing.   Cardiovascular: Negative for chest pain and palpitations.  Gastrointestinal: Positive for abdominal pain (cramping), blood in stool and diarrhea. Negative for heartburn, nausea and vomiting.  Genitourinary: Negative for dysuria, flank pain and hematuria.  Musculoskeletal: Negative for falls and myalgias.  Skin: Negative for itching and rash.  Neurological: Negative for dizziness and weakness.  Psychiatric/Behavioral: Negative.  Negative for depression.      MEDICATIONS AT HOME:   Prior to Admission medications   Medication Sig Start Date End Date Taking? Authorizing Provider  acetaminophen (TYLENOL) 325 MG tablet Take 2 tablets (650 mg total) by mouth every 6 (six) hours as needed for mild pain (or Fever >/= 101). 07/30/15  Yes Gouru, Deanna ArtisAruna, MD  cetirizine (ZYRTEC) 10 MG tablet Take 1 tablet by mouth daily. 04/29/15  Yes [provider]  predniSONE (DELTASONE) 10 MG tablet Days 1-5: Take 4 tablets (40 mg) daily Days 6-10: Take 3 tablets (30 mg) daily Days 11-15: Take 2 tablets (20 mg) daily Days 16-20: Take 1 tablets (10 mg) daily 08/18/16  Yes Phineas SemenGoodman, Graydon, MD  Vitamin D, Ergocalciferol, (DRISDOL) 1.25 MG (50000 UT) CAPS capsule Take 50,000 Units by mouth every 7 (seven) days.  Yes [provider]  Adalimumab (HUMIRA) 40 MG/0.8ML PSKT Inject 0.8 mLs (40 mg total) into the skin once. Repeat injection in 2 weeks. Patient not taking: Reported on 01/27/2019 02/20/16 02/20/16  Chinita Pesterriplett, Cari B, FNP  dicyclomine (BENTYL) 20 MG tablet Take 1 tablet (20 mg total) by mouth 3 (three) times daily as needed for spasms. 08/18/16 08/18/17  Phineas SemenGoodman, Graydon, MD      VITAL SIGNS:  Blood  pressure 119/80, pulse 62, temperature 98.5 F (36.9 C), temperature source Oral, resp. rate 18, height 5\' 5"  (1.651 m), weight 59 kg, last menstrual period 01/19/2019, SpO2 94 %.  PHYSICAL EXAMINATION:  Physical Exam  GENERAL:  23 y.o.-year-old patient lying in the bed with no acute distress.  EYES: Pupils equal, round, reactive to light and accommodation. No scleral icterus. Extraocular muscles intact.  HEENT: Head atraumatic, normocephalic. Oropharynx and nasopharynx clear.  NECK:  Supple, no jugular venous distention. No thyroid enlargement, no tenderness.  LUNGS: Normal breath sounds bilaterally, no wheezing, rales,rhonchi or crepitation. No use of accessory muscles of respiration.  CARDIOVASCULAR: Regular rate and rhythm, S1, S2 normal. No murmurs, rubs, or gallops.  ABDOMEN: Soft, nondistended, general abdominal tenderness. Bowel sounds present. No organomegaly or mass.  EXTREMITIES: No pedal edema, cyanosis, or clubbing.  NEUROLOGIC: Cranial nerves II through XII are intact. Muscle strength 5/5 in all extremities. Sensation intact. Gait not checked.  PSYCHIATRIC: The patient is alert and oriented x 3.  Normal affect and good eye contact. SKIN: No obvious rash, lesion, or ulcer.   LABORATORY PANEL:   CBC Recent Labs  Lab 01/27/19 1205  WBC 10.0  HGB 11.2*  HCT 34.4*  PLT 383   ------------------------------------------------------------------------------------------------------------------  Chemistries  Recent Labs  Lab 01/27/19 1205  NA 139  K 3.1*  CL 102  CO2 28  GLUCOSE 80  BUN 13  CREATININE 0.84  CALCIUM 8.9  AST 12*  ALT 11  ALKPHOS 39  BILITOT 0.7   ------------------------------------------------------------------------------------------------------------------  Cardiac Enzymes No results for input(s): TROPONINI in the last 168 hours. ------------------------------------------------------------------------------------------------------------------   RADIOLOGY:  No results found.    IMPRESSION AND PLAN:   1.   Acute ulcerative colitis flare - IV Solu-Medrol 40 mg twice daily - GI panel pending -C. difficile negative - Patient is being assisted as outpatient to receive Humira by Dr. Laural BenesJohnson, her outpatient gastroenterologist. - Gastroenterology, Dr. Maximino Greenlandahiliani consulted for further recommendations - Patient will be n.p.o. after midnight for possible endoscopy  2.  Anemia - Secondary to #1 - We will continue to monitor hemoglobin hematocrit closely and transfuse if necessary -Continue iron supplementation  3.  Celiac disease - Gluten-free diet ordered  4.  Abdominal pain - We will treat with analgesic as needed  5.  Hypokalemia -Potassium 3.1 -Patient receiving potassium replacement -Will repeat BMP in the a.m. -She is on telemetry monitoring  DVT prophylaxis with SCDs PPI prophylaxis    All the records are reviewed and case discussed with ED provider. The plan of care was discussed in details with the patient (and family). I answered all questions. The patient agreed to proceed with the above mentioned plan. Further management will depend upon hospital course.   CODE STATUS: Full code  TOTAL TIME TAKING CARE OF THIS PATIENT: 45 minutes.    Milas Kocherngela H Shawndra Clute CRNP on 01/27/2019 at 8:05 PM  Pager - [phone]  After 6pm go to [email or web address] - Scientist, research (life sciences)password EPAS [facility]  [facility] [place] [facility]  Office  [phone]  CC: Primary care physician;  [place], [facility]   Note: This dictation was prepared with Dragon dictation along with smaller phrase technology. Any transcriptional errors that result from this process are unintentional.

## 2019-01-27 NOTE — ED Triage Notes (Signed)
Pt here for 4 weeks of blood in stool.  Pt having UC flare and on prednisone from PCP but not improving.  Pt reports unable to see stool from amount of blood when goes. Ambulatory to triage with steady gait. No fevers. No pain at this time but occasionally has some. VSS.  No vomiting.

## 2019-01-27 NOTE — ED Notes (Signed)
Pt resting on stretcher reading. Alert and calm at this time. Denies pain currently. Awaiting admission room order.

## 2019-01-27 NOTE — ED Notes (Signed)
CT at the bedside.  °

## 2019-01-27 NOTE — ED Notes (Signed)
Pt returned from CT. Prepared for transport to admission bed.

## 2019-01-27 NOTE — ED Provider Notes (Signed)
[facility] Emergency Department Provider Note    First MD Initiated Contact with Patient 01/27/19 1606     (approximate)  I have reviewed the triage vital signs and the nursing notes.   HISTORY  Chief Complaint GI Bleeding    HPI Julie Bentley is a 23 y.o. female history of ulcerative colitis currently on a prednisone taper for the past 3weeks presents with persistent bloody diarrhea abdominal pain and cramping.  States that she was previously on Humira 2 years ago but was unable to continue that due to lapse in insurance coverage.  She not have any fevers.  She states that every day she is having multiple episodes of bloody diarrhea.  She is on a blood thinners.  Has been followed by outpatient GI and given her worsening symptoms was directed to the ER for failed outpatient management.    Past Medical History:  Diagnosis Date  . Celiac disease   . UC (ulcerative colitis) (HCC)    History reviewed. No pertinent family history. History reviewed. No pertinent surgical history. Patient Active Problem List   Diagnosis Date Noted  . Malnutrition of moderate degree 07/29/2015  . Anemia 07/26/2015  . UC (ulcerative colitis) (HCC) 07/26/2015  . Celiac disease 07/26/2015      Prior to Admission medications   Medication Sig Start Date End Date Taking? Authorizing Provider  acetaminophen (TYLENOL) 325 MG tablet Take 2 tablets (650 mg total) by mouth every 6 (six) hours as needed for mild pain (or Fever >/= 101). 07/30/15   Gouru, Deanna ArtisAruna, MD  Adalimumab (HUMIRA) 40 MG/0.8ML PSKT Inject 0.8 mLs (40 mg total) into the skin once. Repeat injection in 2 weeks. 02/20/16 02/20/16  Triplett, Kasandra Knudsenari B, FNP  alum & mag hydroxide-simeth (MAALOX/MYLANTA) 200-200-20 MG/5ML suspension Take 30 mLs by mouth every 6 (six) hours as needed for indigestion or heartburn (dyspepsia). 07/30/15   Ramonita LabGouru, Aruna, MD  cetirizine (ZYRTEC) 10 MG tablet Take 1 tablet by mouth daily. 04/29/15    [provider]  dicyclomine (BENTYL) 20 MG tablet Take 1 tablet (20 mg total) by mouth 3 (three) times daily as needed for spasms. 08/18/16 08/18/17  Phineas SemenGoodman, Graydon, MD  feeding supplement, ENSURE ENLIVE, (ENSURE ENLIVE) LIQD Take 237 mLs by mouth 2 (two) times daily between meals. 07/30/15   Ramonita LabGouru, Aruna, MD  ferrous sulfate (FERROUSUL) 325 (65 FE) MG tablet Take 1 tablet (325 mg total) by mouth 2 (two) times daily with a meal. 07/30/15   Gouru, Aruna, MD  HYDROcodone-acetaminophen (NORCO/VICODIN) 5-325 MG tablet Take 1-2 tablets by mouth every 4 (four) hours as needed for moderate pain. 07/30/15   Gouru, Deanna ArtisAruna, MD  Olopatadine HCl 0.2 % SOLN Apply 2 drops to eye daily. 02/20/16   Triplett, Rulon Eisenmengerari B, FNP  predniSONE (DELTASONE) 10 MG tablet Days 1-5: Take 4 tablets (40 mg) daily Days 6-10: Take 3 tablets (30 mg) daily Days 11-15: Take 2 tablets (20 mg) daily Days 16-20: Take 1 tablets (10 mg) daily 08/18/16   Phineas SemenGoodman, Graydon, MD    Allergies Gluten meal    Social History Social History   Tobacco Use  . Smoking status: Never Smoker  . Smokeless tobacco: Never Used  Substance Use Topics  . Alcohol use: No  . Drug use: Not on file    Review of Systems Patient denies headaches, rhinorrhea, blurry vision, numbness, shortness of breath, chest pain, edema, cough, abdominal pain, nausea, vomiting, diarrhea, dysuria, fevers, rashes or hallucinations unless otherwise stated above in HPI.  ____________________________________________   PHYSICAL EXAM:  VITAL SIGNS: Vitals:   01/27/19 1201 01/27/19 1626  BP: 113/62 122/77  Pulse: 72 74  Resp: 14 18  Temp: 98.5 F (36.9 C)   SpO2: 98% 100%    Constitutional: Alert and oriented.  Eyes: Conjunctivae are normal.  Head: Atraumatic. Nose: No congestion/rhinnorhea. Mouth/Throat: Mucous membranes are moist.   Neck: No stridor. Painless ROM.  Cardiovascular: Normal rate, regular rhythm. Grossly normal heart sounds.  Good peripheral  circulation. Respiratory: Normal respiratory effort.  No retractions. Lungs CTAB. Gastrointestinal: Soft and nontender. No distention. No abdominal bruits. No CVA tenderness. Genitourinary: deferred Musculoskeletal: No lower extremity tenderness nor edema.  No joint effusions. Neurologic:  Normal speech and language. No gross focal neurologic deficits are appreciated. No facial droop Skin:  Skin is warm, dry and intact. No rash noted. Psychiatric: Mood and affect are normal. Speech and behavior are normal.  ____________________________________________   LABS (all labs ordered are listed, but only abnormal results are displayed)  Results for orders placed or performed during the hospital encounter of 01/27/19 (from the past 24 hour(s))  Comprehensive metabolic panel     Status: Abnormal   Collection Time: 01/27/19 12:05 PM  Result Value Ref Range   Sodium 139 135 - 145 mmol/L   Potassium 3.1 (L) 3.5 - 5.1 mmol/L   Chloride 102 98 - 111 mmol/L   CO2 28 22 - 32 mmol/L   Glucose, Bld 80 70 - 99 mg/dL   BUN 13 6 - 20 mg/dL   Creatinine, Ser 1.610.84 0.44 - 1.00 mg/dL   Calcium 8.9 8.9 - 09.610.3 mg/dL   Total Protein 6.7 6.5 - 8.1 g/dL   Albumin 3.5 3.5 - 5.0 g/dL   AST 12 (L) 15 - 41 U/L   ALT 11 0 - 44 U/L   Alkaline Phosphatase 39 38 - 126 U/L   Total Bilirubin 0.7 0.3 - 1.2 mg/dL   GFR calc non Af Amer >60 >60 mL/min   GFR calc Af Amer >60 >60 mL/min   Anion gap 9 5 - 15  CBC     Status: Abnormal   Collection Time: 01/27/19 12:05 PM  Result Value Ref Range   WBC 10.0 4.0 - 10.5 K/uL   RBC 3.81 (L) 3.87 - 5.11 MIL/uL   Hemoglobin 11.2 (L) 12.0 - 15.0 g/dL   HCT 04.534.4 (L) 40.936.0 - 81.146.0 %   MCV 90.3 80.0 - 100.0 fL   MCH 29.4 26.0 - 34.0 pg   MCHC 32.6 30.0 - 36.0 g/dL   RDW 91.413.0 78.211.5 - 95.615.5 %   Platelets 383 150 - 400 K/uL   nRBC 0.0 0.0 - 0.2 %  Type and screen [facility]     Status: None   Collection Time: 01/27/19 12:06 PM  Result Value Ref Range    ABO/RH(D) O POS    Antibody Screen NEG    Sample Expiration      01/30/2019,2359 Performed at [facility], [address] [place], [place] [address]   SARS Coronavirus 2 [facility](Hospital order, Performed in [facility] hospital lab) Nasopharyngeal Nasopharyngeal Swab     Status: None   Collection Time: 01/27/19  5:06 PM   Specimen: Nasopharyngeal Swab  Result Value Ref Range   SARS Coronavirus 2 NEGATIVE NEGATIVE   ____________________________________________ ____________________________________________  RADIOLOGY   ____________________________________________   PROCEDURES  Procedure(s) performed:  Procedures    Critical Care performed: no ____________________________________________   INITIAL IMPRESSION / ASSESSMENT AND  PLAN / ED COURSE  Pertinent labs & imaging results that were available during my care of the patient were reviewed by me and considered in my medical decision making (see chart for details).   DDX: Colitis flare, infectious colitis, enteritis, medication noncompliance, GI bleed  Dennis Bast Biffle is a 23 y.o. who presents to the ED with symptoms as described above.  Patient hemodynamically stable.  Hemoglobin is stable.  Seems to be failing outpatient management with oral steroids no longer on immunologic therapy for the past 2 years.  Discussed case with Dr. Bonna Gains of GI who agrees with plan for hospitalization for IV steroids is recommended CT imaging for further evaluation and inpatient GI consultation.     The patient was evaluated in Emergency Department today for the symptoms described in the history of present illness. He/she was evaluated in the context of the global COVID-19 pandemic, which necessitated consideration that the patient might be at risk for infection with the SARS-CoV-2 virus that causes COVID-19. Institutional protocols and algorithms that pertain to the evaluation of patients at risk for COVID-19 are in a state of rapid change  based on information released by regulatory bodies including the CDC and federal and state organizations. These policies and algorithms were followed during the patient's care in the ED.  As part of my medical decision making, I reviewed the following data within the [place] notes reviewed and incorporated, Labs reviewed, notes from prior ED visits and [place] Controlled Substance Database   ____________________________________________   FINAL CLINICAL IMPRESSION(S) / ED DIAGNOSES  Final diagnoses:  Ulcerative colitis with rectal bleeding, unspecified location [facility])      NEW MEDICATIONS STARTED DURING THIS VISIT:  New Prescriptions   No medications on file     Note:  This document was prepared using Dragon voice recognition software and may include unintentional dictation errors.    Merlyn Lot, MD 01/27/19 [phone]

## 2019-01-27 NOTE — ED Notes (Signed)
ED TO INPATIENT HANDOFF REPORT  ED Nurse Name and Phone #: Seth Bake #[phone]  S Name/Age/Gender Julie Bentley 23 y.o. female Room/Bed: ED16A/ED16A  Code Status   Code Status: Full Code  Home/SNF/Other Home Patient oriented to: self, place, time and situation Is this baseline? Yes   Triage Complete: Triage complete  Chief Complaint blood in stool  Triage Note Pt here for 4 weeks of blood in stool.  Pt having UC flare and on prednisone from PCP but not improving.  Pt reports unable to see stool from amount of blood when goes. Ambulatory to triage with steady gait. No fevers. No pain at this time but occasionally has some. VSS.  No vomiting.     Allergies Allergies  Allergen Reactions  . Gluten Meal     Level of Care/Admitting Diagnosis ED Disposition    ED Disposition Condition [place] [facility]: [place] [[id number]]  Level of Care: Med-Surg [16]  Covid Evaluation: Confirmed COVID Negative  Diagnosis: Ulcerative colitis, acute [facility]) [[id number]]  Admitting Physician: Mayer Camel [[id number]]  Attending Physician: Mayer Camel [[id number]]  Estimated length of stay: past midnight tomorrow  Certification:: I certify this patient will need inpatient services for at least 2 midnights  PT Class (Do Not Modify): Inpatient [101]  PT Acc Code (Do Not Modify): Private [1]       B Medical/Surgery History Past Medical History:  Diagnosis Date  . Celiac disease   . UC (ulcerative colitis) ([place])    History reviewed. No pertinent surgical history.   A IV Location/Drains/Wounds Patient Lines/Drains/Airways Status   Active Line/Drains/Airways    Name:   Placement date:   Placement time:   Site:   Days:   Peripheral IV 01/27/19 Left Antecubital   01/27/19    1709    Antecubital   less than 1          Intake/Output Last 24 hours No intake or output data in the 24 hours ending 01/27/19 2039  Labs/Imaging Results for orders placed or  performed during the hospital encounter of 01/27/19 (from the past 48 hour(s))  Comprehensive metabolic panel     Status: Abnormal   Collection Time: 01/27/19 12:05 PM  Result Value Ref Range   Sodium 139 135 - 145 mmol/L   Potassium 3.1 (L) 3.5 - 5.1 mmol/L   Chloride 102 98 - 111 mmol/L   CO2 28 22 - 32 mmol/L   Glucose, Bld 80 70 - 99 mg/dL   BUN 13 6 - 20 mg/dL   Creatinine, Ser 0.84 0.44 - 1.00 mg/dL   Calcium 8.9 8.9 - 10.3 mg/dL   Total Protein 6.7 6.5 - 8.1 g/dL   Albumin 3.5 3.5 - 5.0 g/dL   AST 12 (L) 15 - 41 U/L   ALT 11 0 - 44 U/L   Alkaline Phosphatase 39 38 - 126 U/L   Total Bilirubin 0.7 0.3 - 1.2 mg/dL   GFR calc non Af Amer >60 >60 mL/min   GFR calc Af Amer >60 >60 mL/min   Anion gap 9 5 - 15    Comment: Performed at [facility], [address] [place], [place] [address]  CBC     Status: Abnormal   Collection Time: 01/27/19 12:05 PM  Result Value Ref Range   WBC 10.0 4.0 - 10.5 K/uL   RBC 3.81 (L) 3.87 - 5.11 MIL/uL   Hemoglobin 11.2 (L) 12.0 - 15.0 g/dL   HCT 34.4 (  L) 36.0 - 46.0 %   MCV 90.3 80.0 - 100.0 fL   MCH 29.4 26.0 - 34.0 pg   MCHC 32.6 30.0 - 36.0 g/dL   RDW 40.913.0 81.111.5 - 91.415.5 %   Platelets 383 150 - 400 K/uL   nRBC 0.0 0.0 - 0.2 %    Comment: Performed at [facility], [address] [place], [place] [address]  Type and screen [facility]     Status: None   Collection Time: 01/27/19 12:06 PM  Result Value Ref Range   ABO/RH(D) O POS    Antibody Screen NEG    Sample Expiration      01/30/2019,2359 Performed at [facility], [address] [place], [place] [address]   SARS Coronavirus 2 [facility](Hospital order, Performed in [facility] hospital lab) Nasopharyngeal Nasopharyngeal Swab     Status: None   Collection Time: 01/27/19  5:06 PM   Specimen: Nasopharyngeal Swab  Result Value Ref Range   SARS Coronavirus 2 NEGATIVE NEGATIVE    Comment: (NOTE) If result is NEGATIVE SARS-CoV-2 target  nucleic acids are NOT DETECTED. The SARS-CoV-2 RNA is generally detectable in upper and lower  respiratory specimens during the acute phase of infection. The lowest  concentration of SARS-CoV-2 viral copies this assay can detect is 250  copies / mL. A negative result does not preclude SARS-CoV-2 infection  and should not be used as the sole basis for treatment or other  patient management decisions.  A negative result may occur with  improper specimen collection / handling, submission of specimen other  than nasopharyngeal swab, presence of viral mutation(s) within the  areas targeted by this assay, and inadequate number of viral copies  (<250 copies / mL). A negative result must be combined with clinical  observations, patient history, and epidemiological information. If result is POSITIVE SARS-CoV-2 target nucleic acids are DETECTED. The SARS-CoV-2 RNA is generally detectable in upper and lower  respiratory specimens dur ing the acute phase of infection.  Positive  results are indicative of active infection with SARS-CoV-2.  Clinical  correlation with patient history and other diagnostic information is  necessary to determine patient infection status.  Positive results do  not rule out bacterial infection or co-infection with other viruses. If result is PRESUMPTIVE POSTIVE SARS-CoV-2 nucleic acids MAY BE PRESENT.   A presumptive positive result was obtained on the submitted specimen  and confirmed on repeat testing.  While 2019 novel coronavirus  (SARS-CoV-2) nucleic acids may be present in the submitted sample  additional confirmatory testing may be necessary for epidemiological  and / or clinical management purposes  to differentiate between  SARS-CoV-2 and other Sarbecovirus currently known to infect humans.  If clinically indicated additional testing with an alternate test  methodology [phone]([id number]) is advised. The SARS-CoV-2 RNA is generally  detectable in upper and lower  respiratory sp ecimens during the acute  phase of infection. The expected result is Negative. Fact Sheet for Patients:  BoilerBrush.com.cyhttps://www.fda.gov/media/136312/download Fact Sheet for Healthcare Providers: https://pope.com/https://www.fda.gov/media/136313/download This test is not yet approved or cleared by the [place] FDA and has been authorized for detection and/or diagnosis of SARS-CoV-2 by FDA under an Emergency Use Authorization (EUA).  This EUA will remain in effect (meaning this test can be used) for the duration of the COVID-19 declaration under Section 564(b)(1) of the Act, 21 U.S.C. section 360bbb-3(b)(1), unless the authorization is terminated or revoked sooner. Performed at [facility], [address] [place], [place] [address]  C difficile quick scan w PCR reflex     Status: None   Collection Time: 01/27/19  5:06 PM   Specimen: STOOL  Result Value Ref Range   C Diff antigen NEGATIVE NEGATIVE   C Diff toxin NEGATIVE NEGATIVE   C Diff interpretation No C. difficile detected.     Comment: Performed at [facility], [address] [place], [place] [address]   No results found.  Pending Labs Unresulted Labs (From admission, onward)    Start     Ordered   01/28/19 0500  Basic metabolic panel  Tomorrow morning,   STAT     01/27/19 2003   01/28/19 0500  CBC  Tomorrow morning,   STAT     01/27/19 2003   01/28/19 0500  Protime-INR  Tomorrow morning,   STAT     01/27/19 2003   01/28/19 0500  HIV Antibody (routine testing w rflx)  Once,   R     01/28/19 0500   01/28/19 0000  Hemoglobin and hematocrit, blood  Now then every 8 hours,   STAT     01/27/19 2003   01/27/19 2004  TSH  Once,   STAT     01/27/19 2003   01/27/19 1753  Gastrointestinal Panel by PCR , Stool  (Gastrointestinal Panel by PCR, Stool)  ONCE - STAT,   STAT     01/27/19 1752          Vitals/Pain Today's Vitals   01/27/19 1626 01/27/19 1630 01/27/19 2012 01/27/19 2030  BP: 122/77 119/80 117/68  109/69  Pulse: 74 62 (!) 57 (!) 59  Resp: 18  16 16   Temp:      TempSrc:      SpO2: 100% 94% 100% 100%  Weight:      Height:      PainSc:   0-No pain 0-No pain    Isolation Precautions Enteric precautions (UV disinfection)  Medications Medications  loratadine (CLARITIN) tablet 10 mg (has no administration in time range)  sodium chloride flush (NS) 0.9 % injection 3 mL (has no administration in time range)  sodium chloride flush (NS) 0.9 % injection 3 mL (has no administration in time range)  sodium chloride flush (NS) 0.9 % injection 3 mL (has no administration in time range)  0.9 %  sodium chloride infusion (has no administration in time range)  acetaminophen (TYLENOL) tablet 650 mg (has no administration in time range)    Or  acetaminophen (TYLENOL) suppository 650 mg (has no administration in time range)  ondansetron (ZOFRAN) tablet 4 mg (has no administration in time range)    Or  ondansetron (ZOFRAN) injection 4 mg (has no administration in time range)  methylPREDNISolone sodium succinate (SOLU-MEDROL) 40 mg/mL injection 40 mg (has no administration in time range)  potassium chloride (KLOR-CON) packet 40 mEq (has no administration in time range)  methylPREDNISolone sodium succinate (SOLU-MEDROL) 40 mg/mL injection 40 mg (40 mg Intravenous Given 01/27/19 1711)    Mobility walks Low fall risk   Focused Assessments Cardiac Assessment Handoff:    Lab Results  Component Value Date   TROPONINI < 0.02 03/21/2014   No results found for: DDIMER Does the Patient currently have chest pain? No     R Recommendations: See Admitting Provider Note  Report given to:   Additional Notes: n/a

## 2019-01-28 LAB — C-REACTIVE PROTEIN: CRP: <0.8 mg/dL (ref <1.0)

## 2019-01-28 LAB — CBC
HCT: 37.5 % (ref 36.0–46.0)
Hemoglobin: 12.2 g/dL (ref 12.0–15.0)
MCH: 29.7 pg (ref 26.0–34.0)
MCHC: 32.5 g/dL (ref 30.0–36.0)
MCV: 91.2 fL (ref 80.0–100.0)
Platelets: 419 10*3/uL — ABNORMAL HIGH (ref 150–400)
RBC: 4.11 MIL/uL (ref 3.87–5.11)
RDW: 12.7 % (ref 11.5–15.5)
WBC: 11.3 10*3/uL — ABNORMAL HIGH (ref 4.0–10.5)
nRBC: 0 % (ref 0.0–0.2)

## 2019-01-28 LAB — HEMOGLOBIN AND HEMATOCRIT, BLOOD
HCT: 35 % — ABNORMAL LOW (ref 36.0–46.0)
HCT: 37.3 % (ref 36.0–46.0)
Hemoglobin: 11.5 g/dL — ABNORMAL LOW (ref 12.0–15.0)
Hemoglobin: 12.2 g/dL (ref 12.0–15.0)

## 2019-01-28 LAB — BASIC METABOLIC PANEL
Anion gap: 9 (ref 5–15)
BUN: 11 mg/dL (ref 6–20)
CO2: 27 mmol/L (ref 22–32)
Calcium: 9.5 mg/dL (ref 8.9–10.3)
Chloride: 104 mmol/L (ref 98–111)
Creatinine, Ser: 0.52 mg/dL (ref 0.44–1.00)
GFR calc Af Amer: >60 mL/min (ref >60)
GFR calc non Af Amer: >60 mL/min (ref >60)
Glucose, Bld: 123 mg/dL — ABNORMAL HIGH (ref 70–99)
Potassium: 4 mmol/L (ref 3.5–5.1)
Sodium: 140 mmol/L (ref 135–145)

## 2019-01-28 LAB — PROTIME-INR
INR: 1.1 (ref 0.8–1.2)
Prothrombin Time: 14.4 seconds (ref 11.4–15.2)

## 2019-01-28 NOTE — Progress Notes (Signed)
Order received from Dr Stark Jock for clear liquid diet

## 2019-01-28 NOTE — Progress Notes (Signed)
[facility] [place] NAME: Julie Bentley    MR#:  [id number]  DATE OF BIRTH:  [birth year]  SUBJECTIVE:  CHIEF COMPLAINT:   Chief Complaint  Patient presents with  . GI Bleeding   No new complaint this morning.  Abdominal pains improved.  No bloody stool this morning.  Seen by GI this morning. REVIEW OF SYSTEMS:  Review of Systems  Constitutional: Negative for chills and fever.  HENT: Negative for hearing loss and tinnitus.   Eyes: Negative for blurred vision and double vision.  Respiratory: Negative for cough and hemoptysis.   Cardiovascular: Negative for chest pain and palpitations.  Gastrointestinal: Positive for abdominal pain. Negative for heartburn, nausea and vomiting.  Genitourinary: Negative for dysuria and urgency.  Musculoskeletal: Negative for myalgias and neck pain.  Skin: Negative for itching and rash.  Neurological: Negative for dizziness and headaches.  Psychiatric/Behavioral: Negative for depression and hallucinations.    DRUG ALLERGIES:   Allergies  Allergen Reactions  . Gluten Meal    VITALS:  Blood pressure 117/72, pulse 64, temperature 98 F (36.7 C), temperature source Oral, resp. rate 16, height 5' 5"  (1.651 m), weight 59 kg, last menstrual period 01/19/2019, SpO2 100 %. PHYSICAL EXAMINATION:  Physical Exam  GENERAL:  23 y.o.-year-old patient lying in the bed with no acute distress.  EYES: Pupils equal, round, reactive to light and accommodation. No scleral icterus. Extraocular muscles intact.  HEENT: Head atraumatic, normocephalic. Oropharynx and nasopharynx clear.  NECK:  Supple, no jugular venous distention. No thyroid enlargement, no tenderness.  LUNGS: Normal breath sounds bilaterally, no wheezing, rales,rhonchi or crepitation. No use of accessory muscles of respiration.  CARDIOVASCULAR: Regular rate and rhythm, S1, S2 normal. No murmurs, rubs, or gallops.  ABDOMEN: Soft, nondistended,  mild  generalized tenderness but no rebound or guarding.  Bowel sounds present. No organomegaly or mass.  EXTREMITIES: No pedal edema, cyanosis, or clubbing.  NEUROLOGIC: Cranial nerves II through XII are intact. Muscle strength 5/5 in all extremities. Sensation intact. Gait not checked.  PSYCHIATRIC: The patient is alert and oriented x 3.  Normal affect and good eye contact. SKIN: No obvious rash, lesion, or ulcer. LABORATORY PANEL:  Female CBC Recent Labs  Lab 01/28/19 0521 01/28/19 0751  WBC 11.3*  --   HGB 12.2 11.5*  HCT 37.5 35.0*  PLT 419*  --    ------------------------------------------------------------------------------------------------------------------ Chemistries  Recent Labs  Lab 01/27/19 1205 01/28/19 0521  NA 139 140  K 3.1* 4.0  CL 102 104  CO2 28 27  GLUCOSE 80 123*  BUN 13 11  CREATININE 0.84 0.52  CALCIUM 8.9 9.5  AST 12*  --   ALT 11  --   ALKPHOS 39  --   BILITOT 0.7  --    RADIOLOGY:  Ct Abdomen Pelvis W Contrast  Result Date: 01/27/2019 CLINICAL DATA:  Gastroenteritis or colitis.  Blood in stool. EXAM: CT ABDOMEN AND PELVIS WITH CONTRAST TECHNIQUE: Multidetector CT imaging of the abdomen and pelvis was performed using the standard protocol following bolus administration of intravenous contrast. CONTRAST:  166m OMNIPAQUE IOHEXOL 300 MG/ML  SOLN COMPARISON:  None. FINDINGS: Lower chest: The lung bases are clear. The heart size is normal. Hepatobiliary: The liver is normal. Normal gallbladder.There is no biliary ductal dilation. Pancreas: Normal contours without ductal dilatation. No peripancreatic fluid collection. Spleen: No splenic laceration or hematoma. Adrenals/Urinary Tract: --Adrenal glands: No adrenal hemorrhage. --Right kidney/ureter: No hydronephrosis or perinephric hematoma. --Left kidney/ureter: No  hydronephrosis or perinephric hematoma. --Urinary bladder: Unremarkable. Stomach/Bowel: --Stomach/Duodenum: No hiatal hernia or other gastric  abnormality. Normal duodenal course and caliber. --Small bowel: No dilatation or inflammation. --Colon: There appears to be some fat stranding and wall thickening of the rectum I would distal sigmoid colon. There may be mild wall thickening of the ascending colon and at the level of the splenic flexure. --Appendix: Normal. Vascular/Lymphatic: Normal course and caliber of the major abdominal vessels. --No retroperitoneal lymphadenopathy. --No mesenteric lymphadenopathy. --No pelvic or inguinal lymphadenopathy. Reproductive: Unremarkable Other: No ascites or free air. The abdominal wall is normal. Musculoskeletal. No acute displaced fractures. IMPRESSION: 1. Mild fat stranding and wall thickening of the rectum and distal sigmoid colon suggestive of an infectious or inflammatory colitis. 2. Normal appendix in the right lower quadrant. No evidence for a small bowel obstruction. Electronically Signed   By: Constance Holster M.D.   On: 01/27/2019 21:43   ASSESSMENT AND PLAN:    1.   Acute ulcerative colitis flare - IV Solu-Medrol 40 mg twice daily - GI panel negative -C. difficile negative - Patient is being assisted as outpatient to receive Humira by Dr. Wynetta Emery, her outpatient gastroenterologist. -Patient seen by gastroenterologist Dr. Vicente Males this morning.  Recommendation is to continue IV Solu-Medrol.  If patient does not respond in 48 to 72 hours will need to consider inpatient Remicade.  Planning for flexible sigmoidoscopy tomorrow to rule out HSV and CMV colitis.  Gluten-free diet for celiac disease.  2.  Anemia - Secondary to #1.  Hemoglobin remains fairly stable. -Continue iron supplementation  3.  Celiac disease - Gluten-free diet  4.  Abdominal pain; improved.  Secondary to ulcerative colitis flareup - We will treat with analgesic as needed  5.  Hypokalemia Replaced  DVT prophylaxis with SCDs PPI prophylaxis   All the records are reviewed and case discussed with Care  Management/Social Worker. Management plans discussed with the patient, family and they are in agreement.  CODE STATUS: Full Code  TOTAL TIME TAKING CARE OF THIS PATIENT: 24 minutes.   More than 50% of the time was spent in counseling/coordination of care: YES  POSSIBLE D/C IN 2 DAYS, DEPENDING ON CLINICAL CONDITION.   Deyonna Fitzsimmons M.D on 01/28/2019 at 2:43 PM  Between 7am to 6pm - Pager - [phone]  After 6pm go to [email or web address] - Proofreader  [facility]  Office  [phone]  CC: Primary care physician; [facility]  Note: This dictation was prepared with Dragon dictation along with smaller phrase technology. Any transcriptional errors that result from this process are unintentional.

## 2019-01-28 NOTE — Consult Note (Signed)
Jonathon Bellows , MD [address], [place], [place], [place], [address] [address], [place], [place], [place], [address] Phone: [phone]  Fax: [phone]  Consultation  Referring Provider: Dr Stark Jock Primary Care Physician:  [facility] Primary Gastroenterologist:  Dr. Vira Agar    Reason for Consultation:     Ulcerative colitis   Date of Admission:  01/27/2019 Date of Consultation:  01/28/2019         HPI:   Julie Bentley is a 23 y.o. female is a patient of Dr. Vira Agar who carries a diagnosis of ulcerative pancolitis.  Looking at back at their last clinic note from 12/29/2018 mentions diagnosis made some time in 2016.  Also mentions carries a diagnosis of celiac disease.  Commenced on Humira back in Mar.  On oral iron.  Appears she was doing well from November 2018 till spring 2019 when symptoms recurred.  Per last office note she was commenced on steroids 40 mg of prednisone per day decrease by 10 mg q. 7 days   Readmitted with bloody bowel movements.  TB skin test ordered yesterday.  Hemoglobin on admission 11.2 g with an MCV of 90, normal TSH, GI PCR and C. difficile stool tests are negative.   According the patient she was doing fine till early July 2020 that when she called her GI at [facility].  At that point of time she started having 3-4 bowel movements a day which were bloody with symptoms of incomplete evacuation and cramping early in the morning.  Denies any NSAID use.  Denies any joint pains.  Has a family history of colon cancer.  She has not yet started her Humira and she is awaiting to do so.  Past Medical History:  Diagnosis Date  . Celiac disease   . UC (ulcerative colitis) ([place])     History reviewed. No pertinent surgical history.  Prior to Admission medications   Medication Sig Start Date End Date Taking? Authorizing Provider  acetaminophen (TYLENOL) 325 MG tablet Take 2 tablets (650 mg total) by mouth every 6 (six) hours as needed for mild  pain (or Fever >/= 101). 07/30/15  Yes Gouru, Illene Silver, MD  cetirizine (ZYRTEC) 10 MG tablet Take 1 tablet by mouth daily. 04/29/15  Yes [provider]  predniSONE (DELTASONE) 10 MG tablet Days 1-5: Take 4 tablets (40 mg) daily Days 6-10: Take 3 tablets (30 mg) daily Days 11-15: Take 2 tablets (20 mg) daily Days 16-20: Take 1 tablets (10 mg) daily 08/18/16  Yes Nance Pear, MD  Vitamin D, Ergocalciferol, (DRISDOL) 1.25 MG (50000 UT) CAPS capsule Take 50,000 Units by mouth every 7 (seven) days.   Yes [provider]  Adalimumab (HUMIRA) 40 MG/0.8ML PSKT Inject 0.8 mLs (40 mg total) into the skin once. Repeat injection in 2 weeks. Patient not taking: Reported on 01/27/2019 02/20/16 02/20/16  Victorino Dike, FNP  dicyclomine (BENTYL) 20 MG tablet Take 1 tablet (20 mg total) by mouth 3 (three) times daily as needed for spasms. 08/18/16 08/18/17  Nance Pear, MD    History reviewed. No pertinent family history.   Social History   Tobacco Use  . Smoking status: Never Smoker  . Smokeless tobacco: Never Used  Substance Use Topics  . Alcohol use: No  . Drug use: Not on file    Allergies as of 01/27/2019 - Review Complete 01/27/2019  Allergen Reaction Noted  . Gluten meal  01/27/2019    Review of Systems:    All  systems reviewed and negative except where noted in HPI.   Physical Exam:  Vital signs in last 24 hours: Temp:  [98 F (36.7 C)-98.5 F (36.9 C)] 98 F (36.7 C) (08/01 0532) Pulse Rate:  [52-74] 52 (08/01 0532) Resp:  [14-18] 18 (08/01 0532) BP: (103-129)/(62-80) 103/65 (08/01 0532) SpO2:  [94 %-100 %] 100 % (08/01 0532) Weight:  [59 kg] 59 kg (07/31 1202) Last BM Date: 01/27/19 General:   Pleasant, cooperative in NAD Head:  Normocephalic and atraumatic. Eyes:   No icterus.   Conjunctiva pink. PERRLA. Ears:  Normal auditory acuity. Neck:  Supple; no masses or thyroidomegaly Lungs: Respirations even and unlabored. Lungs clear to auscultation  bilaterally.   No wheezes, crackles, or rhonchi.  Heart:  Regular rate and rhythm;  Without murmur, clicks, rubs or gallops Abdomen:  Soft, nondistended, nontender. Normal bowel sounds. No appreciable masses or hepatomegaly.  No rebound or guarding.  Neurologic:  Alert and oriented x3;  grossly normal neurologically. Skin:  Intact without significant lesions or rashes. Cervical Nodes:  No significant cervical adenopathy. Psych:  Alert and cooperative. Normal affect.  LAB RESULTS: Recent Labs    01/27/19 1205 01/27/19 2347 01/28/19 0521 01/28/19 0751  WBC 10.0  --  11.3*  --   HGB 11.2* 11.6* 12.2 11.5*  HCT 34.4* 35.9* 37.5 35.0*  PLT 383  --  419*  --    BMET Recent Labs    01/27/19 1205 01/28/19 0521  NA 139 140  K 3.1* 4.0  CL 102 104  CO2 28 27  GLUCOSE 80 123*  BUN 13 11  CREATININE 0.84 0.52  CALCIUM 8.9 9.5   LFT Recent Labs    01/27/19 1205  PROT 6.7  ALBUMIN 3.5  AST 12*  ALT 11  ALKPHOS 39  BILITOT 0.7   PT/INR Recent Labs    01/28/19 0521  LABPROT 14.4  INR 1.1    STUDIES: Ct Abdomen Pelvis W Contrast  Result Date: 01/27/2019 CLINICAL DATA:  Gastroenteritis or colitis.  Blood in stool. EXAM: CT ABDOMEN AND PELVIS WITH CONTRAST TECHNIQUE: Multidetector CT imaging of the abdomen and pelvis was performed using the standard protocol following bolus administration of intravenous contrast. CONTRAST:  100mL OMNIPAQUE IOHEXOL 300 MG/ML  SOLN COMPARISON:  None. FINDINGS: Lower chest: The lung bases are clear. The heart size is normal. Hepatobiliary: The liver is normal. Normal gallbladder.There is no biliary ductal dilation. Pancreas: Normal contours without ductal dilatation. No peripancreatic fluid collection. Spleen: No splenic laceration or hematoma. Adrenals/Urinary Tract: --Adrenal glands: No adrenal hemorrhage. --Right kidney/ureter: No hydronephrosis or perinephric hematoma. --Left kidney/ureter: No hydronephrosis or perinephric hematoma. --Urinary  bladder: Unremarkable. Stomach/Bowel: --Stomach/Duodenum: No hiatal hernia or other gastric abnormality. Normal duodenal course and caliber. --Small bowel: No dilatation or inflammation. --Colon: There appears to be some fat stranding and wall thickening of the rectum I would distal sigmoid colon. There may be mild wall thickening of the ascending colon and at the level of the splenic flexure. --Appendix: Normal. Vascular/Lymphatic: Normal course and caliber of the major abdominal vessels. --No retroperitoneal lymphadenopathy. --No mesenteric lymphadenopathy. --No pelvic or inguinal lymphadenopathy. Reproductive: Unremarkable Other: No ascites or free air. The abdominal wall is normal. Musculoskeletal. No acute displaced fractures. IMPRESSION: 1. Mild fat stranding and wall thickening of the rectum and distal sigmoid colon suggestive of an infectious or inflammatory colitis. 2. Normal appendix in the right lower quadrant. No evidence for a small bowel obstruction. Electronically Signed   By: Beryle Quanthristopher  Green M.D.  On: 01/27/2019 21:43      Impression / Plan:   Julie Bentley is a 23 y.o. y/o female carries a diagnosis of ulcerative pancolitis and celiac disease managed by Dr[facility] M[facility]lliott at [facility].  Has been on and off on Humira, and sometime around 2016.  Issues with insurance and inability to afford has prevented to be compliant.  Recently seen by [facility] and commenced on steroids on 01/16/2019.  Admitted with bloody bowel movements.  Infectious colitis has been ruled out with stool test.  CT scan of the abdomen shows mild fat stranding and wall thickening of the rectum and distal sigmoid colon suggestive of an infectious inflammatory colitis.  I do not see any colonoscopy or sigmoidoscopy since 2017.  No mention about repeating per the last GI note.  I have not been able to find any Humira antibody checked.  Plan 1.  Continue IV Solu-Medrol.  If does not respond in 48 to 72 hours would need to  consider inpatient Remicade.  I will order all the labs required in case we need to start 2.  flexible sigmoidoscopy tomorrow to rule out HSV and CMV colitis. 3.  Check CRP at baseline and follow CRP values and fecal calprotectin moving forward. 4.  She will need to be compliant with her Humira, she will require antibodies and Humira levels to be checked prior to the next dose as she has been on and off of it over the years. 5.  [facility] as an [facility]. 6.  Gluten-free diet for celiac disease and she would require health maintenance issues to be addressed for celiac disease such as checking her vitamin levels, bone density scan ,, replacement of [facility] D [facility]  I have discussed alternative options, risks & benefits,  which include, but are not limited to, bleeding, infection, perforation,respiratory complication & drug reaction.  The patient agrees with this plan & written consent will be obtained.     Thank you for involving me in the care of this patient.      LOS: 1 day   Wyline MoodKiran Allexa Acoff, MD  01/28/2019, 9:19 AM

## 2019-01-29 ENCOUNTER — Inpatient Hospital Stay: Payer: BC Managed Care – PPO | Admitting: Certified Registered"

## 2019-01-29 ENCOUNTER — Encounter: Payer: Self-pay | Admitting: Anesthesiology

## 2019-01-29 ENCOUNTER — Encounter
Admission: EM | Disposition: A | Discharge status: Home / Self Care | Payer: Self-pay | Source: Home / Self Care | Attending: Internal Medicine

## 2019-01-29 HISTORY — PX: FLEXIBLE SIGMOIDOSCOPY: SHX5431

## 2019-01-29 LAB — CBC
HCT: 34.4 % — ABNORMAL LOW (ref 36.0–46.0)
Hemoglobin: 11.3 g/dL — ABNORMAL LOW (ref 12.0–15.0)
MCH: 29.5 pg (ref 26.0–34.0)
MCHC: 32.8 g/dL (ref 30.0–36.0)
MCV: 89.8 fL (ref 80.0–100.0)
Platelets: 406 10*3/uL — ABNORMAL HIGH (ref 150–400)
RBC: 3.83 MIL/uL — ABNORMAL LOW (ref 3.87–5.11)
RDW: 12.7 % (ref 11.5–15.5)
WBC: 11.2 10*3/uL — ABNORMAL HIGH (ref 4.0–10.5)
nRBC: 0 % (ref 0.0–0.2)

## 2019-01-29 LAB — BASIC METABOLIC PANEL
Anion gap: 8 (ref 5–15)
BUN: 13 mg/dL (ref 6–20)
CO2: 28 mmol/L (ref 22–32)
Calcium: 9.4 mg/dL (ref 8.9–10.3)
Chloride: 101 mmol/L (ref 98–111)
Creatinine, Ser: 0.66 mg/dL (ref 0.44–1.00)
GFR calc Af Amer: >60 mL/min (ref >60)
GFR calc non Af Amer: >60 mL/min (ref >60)
Glucose, Bld: 123 mg/dL — ABNORMAL HIGH (ref 70–99)
Potassium: 4.4 mmol/L (ref 3.5–5.1)
Sodium: 137 mmol/L (ref 135–145)

## 2019-01-29 LAB — MAGNESIUM: Magnesium: 2.2 mg/dL (ref 1.7–2.4)

## 2019-01-29 SURGERY — SIGMOIDOSCOPY, FLEXIBLE
Anesthesia: General

## 2019-01-29 MED ORDER — MIDAZOLAM HCL 5 MG/5ML IJ SOLN
INTRAMUSCULAR | Status: DC | PRN
  Administered 2019-01-29: 2 mg via INTRAVENOUS

## 2019-01-29 MED ORDER — PROPOFOL 10 MG/ML IV BOLUS
INTRAVENOUS | Status: DC | PRN
  Administered 2019-01-29: 70 mg via INTRAVENOUS

## 2019-01-29 MED ORDER — MIDAZOLAM HCL 2 MG/2ML IJ SOLN
INTRAMUSCULAR | Status: AC
  Filled 2019-01-29: qty 2

## 2019-01-29 MED ORDER — PROPOFOL 500 MG/50ML IV EMUL
INTRAVENOUS | Status: DC | PRN
  Administered 2019-01-29: 120 ug/kg/min via INTRAVENOUS

## 2019-01-29 MED ORDER — SODIUM CHLORIDE 0.9 % IV SOLN
INTRAVENOUS | Status: DC
  Administered 2019-01-29: 1000 mL via INTRAVENOUS

## 2019-01-29 NOTE — H&P (Signed)
Wyline MoodKiran Laneka Mcgrory, MD [address], [address], [place], [place], [id number] [address], [address], [place], [place], [address] Phone: [phone]  Fax: [phone]  Primary Care Physician:  [facility] [place], [facility]   Pre-Procedure History & Physical: HPI:  Julie Bentley is a 23 y.o. female is here for a flexible sigmoidoscopy    Past Medical History:  Diagnosis Date  . Celiac disease   . UC (ulcerative colitis) ([facility])     History reviewed. No pertinent surgical history.  Prior to Admission medications   Medication Sig Start Date End Date Taking? Authorizing Provider  acetaminophen (TYLENOL) 325 MG tablet Take 2 tablets (650 mg total) by mouth every 6 (six) hours as needed for mild pain (or Fever >/= 101). 07/30/15  Yes Gouru, Deanna ArtisAruna, MD  cetirizine (ZYRTEC) 10 MG tablet Take 1 tablet by mouth daily. 04/29/15  Yes [provider]  predniSONE (DELTASONE) 10 MG tablet Days 1-5: Take 4 tablets (40 mg) daily Days 6-10: Take 3 tablets (30 mg) daily Days 11-15: Take 2 tablets (20 mg) daily Days 16-20: Take 1 tablets (10 mg) daily 08/18/16  Yes Phineas SemenGoodman, Graydon, MD  Vitamin D, Ergocalciferol, (DRISDOL) 1.25 MG (50000 UT) CAPS capsule Take 50,000 Units by mouth every 7 (seven) days.   Yes [provider]  Adalimumab (HUMIRA) 40 MG/0.8ML PSKT Inject 0.8 mLs (40 mg total) into the skin once. Repeat injection in 2 weeks. Patient not taking: Reported on 01/27/2019 02/20/16 02/20/16  Chinita Pesterriplett, Cari B, FNP  dicyclomine (BENTYL) 20 MG tablet Take 1 tablet (20 mg total) by mouth 3 (three) times daily as needed for spasms. 08/18/16 08/18/17  Phineas SemenGoodman, Graydon, MD    Allergies as of 01/27/2019 - Review Complete 01/27/2019  Allergen Reaction Noted  . Gluten meal  01/27/2019    History reviewed. No pertinent family history.  Social History   Socioeconomic History  . Marital status: Single    Spouse name: Not on file  . Number of children: Not on file  . Years of education: Not  on file  . Highest education level: Not on file  Occupational History  . Not on file  Social Needs  . Financial resource strain: Not on file  . Food insecurity    Worry: Not on file    Inability: Not on file  . Transportation needs    Medical: Not on file    Non-medical: Not on file  Tobacco Use  . Smoking status: Never Smoker  . Smokeless tobacco: Never Used  Substance and Sexual Activity  . Alcohol use: No  . Drug use: Not on file  . Sexual activity: Not on file  Lifestyle  . Physical activity    Days per week: Not on file    Minutes per session: Not on file  . Stress: Not on file  Relationships  . Social Musicianconnections    Talks on phone: Not on file    Gets together: Not on file    Attends religious service: Not on file    Active member of club or organization: Not on file    Attends meetings of clubs or organizations: Not on file    Relationship status: Not on file  . Intimate partner violence    Fear of current or ex partner: Not on file    Emotionally abused: Not on file    Physically abused: Not on file    Forced sexual activity: Not on file  Other Topics Concern  . Not on  file  Social History Narrative  . Not on file    Review of Systems: See HPI, otherwise negative ROS  Physical Exam: BP 103/82 (BP Location: Right Arm)   Pulse 73   Temp 97.6 F (36.4 C) (Oral)   Resp 20   Ht 5\' 5"  (1.651 m)   Wt 59 kg   LMP 01/19/2019 (Approximate)   SpO2 97%   BMI 21.63 kg/m  General:   Alert,  pleasant and cooperative in NAD Head:  Normocephalic and atraumatic. Neck:  Supple; no masses or thyromegaly. Lungs:  Clear throughout to auscultation, normal respiratory effort.    Heart:  +S1, +S2, Regular rate and rhythm, No edema. Abdomen:  Soft, nontender and nondistended. Normal bowel sounds, without guarding, and without rebound.   Neurologic:  Alert and  oriented x4;  grossly normal neurologically.  Impression/Plan: Julie Bentley is here for an colonoscopy to be  performed for sigmoidoscopy for ulcerative colitis. Risks, benefits, limitations, and alternatives regarding  colonoscopy have been reviewed with the patient.  Questions have been answered.  All parties agreeable.   Jonathon Bellows, MD  01/29/2019, 1:20 PM

## 2019-01-29 NOTE — Transfer of Care (Signed)
Immediate Anesthesia Transfer of Care Note  Patient: Julie Bentley  Procedure(s) Performed: FLEXIBLE SIGMOIDOSCOPY (N/A )  Patient Location: Endoscopy Unit  Anesthesia Type:General  Level of Consciousness: awake  Airway & Oxygen Therapy: Patient Spontanous Breathing and Patient connected to nasal cannula oxygen  Post-op Assessment: Report given to RN and Post -op Vital signs reviewed and stable  Post vital signs: Reviewed  Last Vitals:  Vitals Value Taken Time  BP 112/72 01/29/19 1342  Temp    Pulse 52 01/29/19 1342  Resp 14 01/29/19 1342  SpO2 100 % 01/29/19 1342    Last Pain:  Vitals:   01/29/19 1214  TempSrc: Oral  PainSc:       Patients Stated Pain Goal: 1 (34/35/68 6168)  Complications: No apparent anesthesia complications

## 2019-01-29 NOTE — Progress Notes (Signed)
[facility] [place] NAME: Julie Bentley    MR#:  [id number]  DATE OF BIRTH:  [birth year]  SUBJECTIVE:  CHIEF COMPLAINT:   Chief Complaint  Patient presents with  . GI Bleeding   No new complaint this morning.  Abdominal pains improved.  Reported having one episode of bloody stool this morning.  Patient scheduled for flexible sigmoidoscopy today   REVIEW OF SYSTEMS:  Review of Systems  Constitutional: Negative for chills and fever.  HENT: Negative for hearing loss and tinnitus.   Eyes: Negative for blurred vision and double vision.  Respiratory: Negative for cough and hemoptysis.   Cardiovascular: Negative for chest pain and palpitations.  Gastrointestinal: Positive for abdominal pain. Negative for heartburn, nausea and vomiting.  Genitourinary: Negative for dysuria and urgency.  Musculoskeletal: Negative for myalgias and neck pain.  Skin: Negative for itching and rash.  Neurological: Negative for dizziness and headaches.  Psychiatric/Behavioral: Negative for depression and hallucinations.    DRUG ALLERGIES:   Allergies  Allergen Reactions  . Gluten Meal    VITALS:  Blood pressure 103/82, pulse 73, temperature 97.6 F (36.4 C), temperature source Oral, resp. rate 20, height 5' 5"  (1.651 m), weight 59 kg, last menstrual period 01/19/2019, SpO2 97 %. PHYSICAL EXAMINATION:  Physical Exam  GENERAL:  23 y.o.-year-old patient lying in the bed with no acute distress.  EYES: Pupils equal, round, reactive to light and accommodation. No scleral icterus. Extraocular muscles intact.  HEENT: Head atraumatic, normocephalic. Oropharynx and nasopharynx clear.  NECK:  Supple, no jugular venous distention. No thyroid enlargement, no tenderness.  LUNGS: Normal breath sounds bilaterally, no wheezing, rales,rhonchi or crepitation. No use of accessory muscles of respiration.  CARDIOVASCULAR: Regular rate and rhythm, S1, S2 normal. No murmurs, rubs,  or gallops.  ABDOMEN: Soft, nondistended,  mild generalized tenderness but no rebound or guarding.  Bowel sounds present. No organomegaly or mass.  EXTREMITIES: No pedal edema, cyanosis, or clubbing.  NEUROLOGIC: Cranial nerves II through XII are intact. Muscle strength 5/5 in all extremities. Sensation intact. Gait not checked.  PSYCHIATRIC: The patient is alert and oriented x 3.  Normal affect and good eye contact. SKIN: No obvious rash, lesion, or ulcer. LABORATORY PANEL:  Female CBC Recent Labs  Lab 01/29/19 0631  WBC 11.2*  HGB 11.3*  HCT 34.4*  PLT 406*   ------------------------------------------------------------------------------------------------------------------ Chemistries  Recent Labs  Lab 01/27/19 1205  01/29/19 0631  NA 139   < > 137  K 3.1*   < > 4.4  CL 102   < > 101  CO2 28   < > 28  GLUCOSE 80   < > 123*  BUN 13   < > 13  CREATININE 0.84   < > 0.66  CALCIUM 8.9   < > 9.4  MG  --   --  2.2  AST 12*  --   --   ALT 11  --   --   ALKPHOS 39  --   --   BILITOT 0.7  --   --    < > = values in this interval not displayed.   RADIOLOGY:  No results found. ASSESSMENT AND PLAN:    1.   Acute ulcerative colitis flare - IV Solu-Medrol 40 mg twice daily - GI panel negative -C. difficile negative - Patient is being assisted as outpatient to receive Humira by Dr. Wynetta Emery, Julie Bentley outpatient gastroenterologist. -Patient seen by gastroenterologist Dr. Vicente Males.  Recommendation is  to continue IV Solu-Medrol.  If patient does not respond in 48 to 72 hours will need to consider inpatient Remicade.   -Patient scheduled for flexible sigmoidoscopy today to rule out HSV and CMV colitis.  - Gluten-free diet for celiac disease.  2.  Anemia - Secondary to #1.  Hemoglobin remains fairly stable. -Continue iron supplementation  3.  Celiac disease - Gluten-free diet  4.  Abdominal pain; improved.  Secondary to ulcerative colitis flareup - We will treat with analgesic as  needed  5.  Hypokalemia Replaced  DVT prophylaxis with SCDs.  Avoiding heparin products due to bloody diarrhea PPI prophylaxis   All the records are reviewed and case discussed with Care Management/Social Worker. Management plans discussed with the patient, family and they are in agreement.  CODE STATUS: Full Code  TOTAL TIME TAKING CARE OF THIS PATIENT: 35 minutes.   More than 50% of the time was spent in counseling/coordination of care: YES  POSSIBLE D/C IN 2 DAYS, DEPENDING ON CLINICAL CONDITION.   Adetokunbo Mccadden M.D on 01/29/2019 at 1:18 PM  Between 7am to 6pm - Pager - [phone]  After 6pm go to [email or web address] - Proofreader  [facility]  Office  [phone]  CC: Primary care physician; [facility]  Note: This dictation was prepared with Dragon dictation along with smaller phrase technology. Any transcriptional errors that result from this process are unintentional.

## 2019-01-29 NOTE — Anesthesia Postprocedure Evaluation (Signed)
Anesthesia Post Note  Patient: Julie Bentley  Procedure(s) Performed: FLEXIBLE SIGMOIDOSCOPY (N/A )  Patient location during evaluation: Endoscopy Anesthesia Type: General Level of consciousness: awake and alert Pain management: pain level controlled Vital Signs Assessment: post-procedure vital signs reviewed and stable Respiratory status: spontaneous breathing, nonlabored ventilation, respiratory function stable and patient connected to nasal cannula oxygen Cardiovascular status: blood pressure returned to baseline and stable Postop Assessment: no apparent nausea or vomiting Anesthetic complications: no     Last Vitals:  Vitals:   01/29/19 1411 01/29/19 1421  BP: 119/86 116/79  Pulse: (!) 45 (!) 46  Resp: 16 16  Temp:    SpO2: 100% 100%    Last Pain:  Vitals:   01/29/19 1421  TempSrc:   PainSc: 0-No pain                 Martha Clan

## 2019-01-29 NOTE — Anesthesia Preprocedure Evaluation (Signed)
Anesthesia Evaluation  Patient identified by MRN, date of birth, ID band Patient awake    Reviewed: Allergy & Precautions, H&P , NPO status , Patient's Chart, lab work & pertinent test results, reviewed documented beta blocker date and time   History of Anesthesia Complications Negative for: history of anesthetic complications  Airway Mallampati: I  TM Distance: >3 FB Neck ROM: full    Dental  (+) Dental Advidsory Given, Teeth Intact   Pulmonary neg pulmonary ROS,    Pulmonary exam normal        Cardiovascular Exercise Tolerance: Good negative cardio ROS Normal cardiovascular exam     Neuro/Psych negative neurological ROS  negative psych ROS   GI/Hepatic Neg liver ROS, Ulcerative colitis and celiac disease   Endo/Other  negative endocrine ROS  Renal/GU negative Renal ROS  negative genitourinary   Musculoskeletal   Abdominal   Peds  Hematology negative hematology ROS (+)   Anesthesia Other Findings Past Medical History: No date: Celiac disease No date: UC (ulcerative colitis) ([place])   Reproductive/Obstetrics negative OB ROS                             Anesthesia Physical Anesthesia Plan  ASA: II  Anesthesia Plan: General   Post-op Pain Management:    Induction: Intravenous  PONV Risk Score and Plan: 3 and Propofol infusion and TIVA  Airway Management Planned: Nasal Cannula and Natural Airway  Additional Equipment:   Intra-op Plan:   Post-operative Plan:   Informed Consent: I have reviewed the patients History and Physical, chart, labs and discussed the procedure including the risks, benefits and alternatives for the proposed anesthesia with the patient or authorized representative who has indicated his/her understanding and acceptance.     Dental Advisory Given  Plan Discussed with: Anesthesiologist, CRNA and Surgeon  Anesthesia Plan Comments:          Anesthesia Quick Evaluation

## 2019-01-29 NOTE — Progress Notes (Signed)
Initial Nutrition Assessment  DOCUMENTATION CODES:   Not applicable  INTERVENTION:  Will monitor for adequacy of intake with diet advancement and for any education needs.  Recommend outpatient monitoring of iron, calcium, thiamine, riboflavin, niacin, and folate levels and replacement as needed.  NUTRITION DIAGNOSIS:   Increased nutrient needs related to catabolic illness(UC flare) as evidenced by estimated needs.  GOAL:   Patient will meet greater than or equal to 90% of their needs  MONITOR:   Diet advancement, PO intake, Labs, Weight trends, I & O's  REASON FOR ASSESSMENT:   Malnutrition Screening Tool    ASSESSMENT:   23 year old female with PMHx of ulcerative colitis, Celiac disease admitted with acute UC flare, anemia.   Unable to meet with patient at bedside as she had just been taken down to endoscopy suite for flexible sigmoidoscopy to rule out HSV and CMV colitis. Per chart patient's baseline diet is a gluten-free diet in setting of her Celiac disease. She has been on this diet for at least 3 years as she was seen by an RD in 2017 and was already following this diet. Will monitor adequacy of intake with diet advancement and for any education needs.  Patient appears weight-stable per chart.  Medications reviewed and include: Solu-Medrol 40 mg Q12hrs IV, potassium chloride 40 mEq BID, NS at 20 mL/hr.  Labs reviewed.  NUTRITION - FOCUSED PHYSICAL EXAM:  Unable to complete at this time.  Diet Order:   Diet Order            Diet NPO time specified  Diet effective midnight             EDUCATION NEEDS:   No education needs have been identified at this time  Skin:  Skin Assessment: Reviewed RN Assessment  Last BM:  01/29/2019 - small type 2  Height:   Ht Readings from Last 1 Encounters:  01/27/19 5\' 5"  (1.651 m)   Weight:   Wt Readings from Last 1 Encounters:  01/27/19 59 kg   Ideal Body Weight:  56.8 kg  BMI:  Body mass index is 21.63  kg/m.  Estimated Nutritional Needs:   Kcal:  1700-1900  Protein:  80-90 grams  Fluid:  1.8-2 L/day  Willey Blade, MS, RD, LDN Office: [phone] Pager: [phone] After Hours/Weekend Pager: [phone]

## 2019-01-29 NOTE — Op Note (Addendum)
[facility] Gastroenterology Patient Name: Julie Bentley Procedure Date: 01/29/2019 12:43 PM MRN: [id number] Account #: [id number] Date of Birth: [birth year] Admit Type: Inpatient Age: 23 Room: [facility] ENDO ROOM 1 Gender: Female Note Status: Finalized Procedure:            Flexible Sigmoidoscopy Indications:          Ulcerative colitis Providers:            Jonathon Bellows MD, MD Medicines:            Monitored Anesthesia Care Complications:        No immediate complications. Procedure:            Pre-Anesthesia Assessment:                       - Prior to the procedure, a History and Physical was                        performed, and patient medications, allergies and                        sensitivities were reviewed. The patient's tolerance of                        previous anesthesia was reviewed.                       - The risks and benefits of the procedure and the                        sedation options and risks were discussed with the                        patient. All questions were answered and informed                        consent was obtained.                       - ASA Grade Assessment: II - A patient with mild                        systemic disease.                       After obtaining informed consent, the scope was passed                        under direct vision. The Colonoscope was introduced                        through the anus and advanced to the the left                        transverse colon. The flexible sigmoidoscopy was                        accomplished with ease. The patient tolerated the                        procedure well. The quality of the bowel preparation  was adequate. Findings:      The perianal and digital rectal examinations were normal.      Inflammation characterized by adherent blood, congestion (edema),       erosions, erythema, loss of vascularity and mucus was found in a       continuous  and circumferential pattern from the anus to the splenic       flexure. The transverse colon was spared. This was graded as Mayo Score       3 (severe, with spontaneous bleeding, ulcerations). Biopsies were taken       with a cold forceps for histology. Impression:           - Left-sided colitis. Inflammation was found from the                        anus to the splenic flexure. This was graded as Mayo                        Score 3 (severe disease). Biopsied. Recommendation:       - Return patient to hospital ward for ongoing care.                       - Advance diet as tolerated.                       - Await pathology results.                       - 1. R/o HSV/CMV on biopsies                       2. If no better in 48-76 hours on iv steroids then will                        need inpatient remicaid Procedure Code(s):    --- Professional ---                       [phone] [id number], Sigmoidoscopy, flexible; with biopsy, single or                        multiple Diagnosis Code(s):    --- Professional ---                       K52.9, Noninfective gastroenteritis and colitis,                        unspecified                       K51.90, Ulcerative colitis, unspecified, without                        complications CPT copyright 2019 American Medical Association. All rights reserved. The codes documented in this report are preliminary and upon coder review may  be revised to meet current compliance requirements. Wyline MoodKiran Hadyn Blanck, MD Wyline MoodKiran Symone Cornman MD, MD 01/29/2019 1:45:22 PM This report has been signed electronically. Number of Addenda: 0 Note Initiated On: 01/29/2019 12:43 PM Total Procedure Duration: 0 hours 13 minutes 9 seconds  Estimated Blood Loss: Estimated blood loss: none.      [facility]

## 2019-01-29 NOTE — Anesthesia Post-op Follow-up Note (Signed)
Anesthesia QCDR form completed.        

## 2019-01-30 ENCOUNTER — Encounter: Payer: Self-pay | Admitting: Gastroenterology

## 2019-01-30 ENCOUNTER — Other Ambulatory Visit: Payer: Medicaid Other

## 2019-01-30 LAB — BASIC METABOLIC PANEL
Anion gap: 7 (ref 5–15)
BUN: 15 mg/dL (ref 6–20)
CO2: 27 mmol/L (ref 22–32)
Calcium: 8.8 mg/dL — ABNORMAL LOW (ref 8.9–10.3)
Chloride: 102 mmol/L (ref 98–111)
Creatinine, Ser: 0.64 mg/dL (ref 0.44–1.00)
GFR calc Af Amer: >60 mL/min (ref >60)
GFR calc non Af Amer: >60 mL/min (ref >60)
Glucose, Bld: 122 mg/dL — ABNORMAL HIGH (ref 70–99)
Potassium: 3.8 mmol/L (ref 3.5–5.1)
Sodium: 136 mmol/L (ref 135–145)

## 2019-01-30 LAB — HEPATITIS B CORE ANTIBODY, TOTAL: Hep B Core Total Ab: NEGATIVE

## 2019-01-30 LAB — HEPATITIS A ANTIBODY, TOTAL: hep A Total Ab: POSITIVE — AB

## 2019-01-30 LAB — HEPATITIS B E ANTIGEN: Hep B E Ag: NEGATIVE

## 2019-01-30 LAB — HEPATITIS C ANTIBODY: HCV Ab: <0.1 s/co ratio (ref 0.0–0.9)

## 2019-01-30 LAB — HIV ANTIBODY (ROUTINE TESTING W REFLEX): HIV Screen 4th Generation wRfx: NONREACTIVE

## 2019-01-30 LAB — FERRITIN: Ferritin: 33 ng/mL (ref 11–307)

## 2019-01-30 LAB — MAGNESIUM: Magnesium: 2.3 mg/dL (ref 1.7–2.4)

## 2019-01-30 LAB — HEPATITIS B E ANTIBODY: Hep B E Ab: NEGATIVE

## 2019-01-30 LAB — HEPATITIS B SURFACE ANTIGEN: Hepatitis B Surface Ag: NEGATIVE

## 2019-01-30 MED ORDER — METHYLPREDNISOLONE SODIUM SUCC 125 MG IJ SOLR
60.0000 mg | Freq: Every day | INTRAMUSCULAR | Status: DC
  Administered 2019-01-31 – 2019-02-05 (×6): 60 mg via INTRAVENOUS
  Filled 2019-01-30 (×6): qty 2

## 2019-01-30 MED ORDER — ENOXAPARIN SODIUM 30 MG/0.3ML ~~LOC~~ SOLN
30.0000 mg | SUBCUTANEOUS | Status: DC
  Administered 2019-01-30 – 2019-02-01 (×3): 30 mg via SUBCUTANEOUS
  Filled 2019-01-30 (×3): qty 0.3

## 2019-01-30 NOTE — Progress Notes (Addendum)
Julie Darby, MD [address]  [place]  [place], [place] [address]  Main: [phone]  Fax: [phone] Pager: [phone]   Subjective: Feels more or less the same.  She had one episode of bloody bowel movement today.  She denies fever, chills, nausea or vomiting  Objective: Vital signs in last 24 hours: Vitals:   01/29/19 1421 01/29/19 1952 01/30/19 0451 01/30/19 1150  BP: 116/79 109/67 111/74 105/69  Pulse: (!) 46 61 60 (!) 112  Resp: 16 16 16 16   Temp:  98.1 F (36.7 C) 98.1 F (36.7 C) 98.3 F (36.8 C)  TempSrc:  Oral Oral Oral  SpO2: 100% 100% 98% 100%  Weight:      Height:       Weight change:   Intake/Output Summary (Last 24 hours) at 01/30/2019 1815 Last data filed at 01/30/2019 1405 Gross per 24 hour  Intake 360 ml  Output 502 ml  Net -142 ml     Exam: Heart:: Regular rate and rhythm or S1S2 present Lungs: normal and clear to auscultation Abdomen: soft, nontender, normal bowel sounds   Lab Results: CBC Latest Ref Rng & Units 01/29/2019 01/28/2019 01/28/2019  WBC 4.0 - 10.5 K/uL 11.2(H) - -  Hemoglobin 12.0 - 15.0 g/dL 11.3(L) 12.2 11.5(L)  Hematocrit 36.0 - 46.0 % 34.4(L) 37.3 35.0(L)  Platelets 150 - 400 K/uL 406(H) - -   CMP Latest Ref Rng & Units 01/30/2019 01/29/2019 01/28/2019  Glucose 70 - 99 mg/dL 122(H) 123(H) 123(H)  BUN 6 - 20 mg/dL 15 13 11   Creatinine 0.44 - 1.00 mg/dL 0.64 0.66 0.52  Sodium 135 - 145 mmol/L 136 137 140  Potassium 3.5 - 5.1 mmol/L 3.8 4.4 4.0  Chloride 98 - 111 mmol/L 102 101 104  CO2 22 - 32 mmol/L 27 28 27   Calcium 8.9 - 10.3 mg/dL 8.8(L) 9.4 9.5  Total Protein 6.5 - 8.1 g/dL - - -  Total Bilirubin 0.3 - 1.2 mg/dL - - -  Alkaline Phos 38 - 126 U/L - - -  AST 15 - 41 U/L - - -  ALT 0 - 44 U/L - - -    Micro Results: Recent Results (from the past 240 hour(s))  SARS Coronavirus 2 [facility] order, Performed in [facility] hospital lab) Nasopharyngeal Nasopharyngeal Swab     Status: None   Collection Time:  01/27/19  5:06 PM   Specimen: Nasopharyngeal Swab  Result Value Ref Range Status   SARS Coronavirus 2 NEGATIVE NEGATIVE Final    Comment: (NOTE) If result is NEGATIVE SARS-CoV-2 target nucleic acids are NOT DETECTED. The SARS-CoV-2 RNA is generally detectable in upper and lower  respiratory specimens during the acute phase of infection. The lowest  concentration of SARS-CoV-2 viral copies this assay can detect is 250  copies / mL. A negative result does not preclude SARS-CoV-2 infection  and should not be used as the sole basis for treatment or other  patient management decisions.  A negative result may occur with  improper specimen collection / handling, submission of specimen other  than nasopharyngeal swab, presence of viral mutation(s) within the  areas targeted by this assay, and inadequate number of viral copies  (<250 copies / mL). A negative result must be combined with clinical  observations, patient history, and epidemiological information. If result is POSITIVE SARS-CoV-2 target nucleic acids are DETECTED. The SARS-CoV-2 RNA is generally detectable in upper and lower  respiratory specimens dur ing the acute phase of infection.  Positive  results are indicative of active infection with SARS-CoV-2.  Clinical  correlation with patient history and other diagnostic information is  necessary to determine patient infection status.  Positive results do  not rule out bacterial infection or co-infection with other viruses. If result is PRESUMPTIVE POSTIVE SARS-CoV-2 nucleic acids MAY BE PRESENT.   A presumptive positive result was obtained on the submitted specimen  and confirmed on repeat testing.  While 2019 novel coronavirus  (SARS-CoV-2) nucleic acids may be present in the submitted sample  additional confirmatory testing may be necessary for epidemiological  and / or clinical management purposes  to differentiate between  SARS-CoV-2 and other Sarbecovirus currently known to  infect humans.  If clinically indicated additional testing with an alternate test  methodology [id number]) is advised. The SARS-CoV-2 RNA is generally  detectable in upper and lower respiratory sp ecimens during the acute  phase of infection. The expected result is Negative. Fact Sheet for Patients:  BoilerBrush.com.cyhttps://www.fda.gov/media/136312/download Fact Sheet for Healthcare Providers: https://pope.com/https://www.fda.gov/media/136313/download This test is not yet approved or cleared by the [place] FDA and has been authorized for detection and/or diagnosis of SARS-CoV-2 by FDA under an Emergency Use Authorization (EUA).  This EUA will remain in effect (meaning this test can be used) for the duration of the COVID-19 declaration under Section 564(b)(1) of the Act, 21 U.S.C. section 360bbb-3(b)(1), unless the authorization is terminated or revoked sooner. Performed at [facility], [address] [place], [place] [address]   C difficile quick scan w PCR reflex     Status: None   Collection Time: 01/27/19  5:06 PM   Specimen: STOOL  Result Value Ref Range Status   C Diff antigen NEGATIVE NEGATIVE Final   C Diff toxin NEGATIVE NEGATIVE Final   C Diff interpretation No C. difficile detected.  Final    Comment: Performed at [facility], [address] [place], [place] [address]  Gastrointestinal Panel by PCR , Stool     Status: None   Collection Time: 01/27/19  5:06 PM   Specimen: Stool  Result Value Ref Range Status   Campylobacter species NOT DETECTED NOT DETECTED Final   Plesimonas shigelloides NOT DETECTED NOT DETECTED Final   Salmonella species NOT DETECTED NOT DETECTED Final   Yersinia enterocolitica NOT DETECTED NOT DETECTED Final   Vibrio species NOT DETECTED NOT DETECTED Final   Vibrio cholerae NOT DETECTED NOT DETECTED Final   Enteroaggregative E coli (EAEC) NOT DETECTED NOT DETECTED Final   Enteropathogenic E coli (EPEC) NOT DETECTED NOT DETECTED Final   Enterotoxigenic  E coli (ETEC) NOT DETECTED NOT DETECTED Final   Shiga like toxin producing E coli (STEC) NOT DETECTED NOT DETECTED Final   Shigella/Enteroinvasive E coli (EIEC) NOT DETECTED NOT DETECTED Final   Cryptosporidium NOT DETECTED NOT DETECTED Final   Cyclospora cayetanensis NOT DETECTED NOT DETECTED Final   Entamoeba histolytica NOT DETECTED NOT DETECTED Final   Giardia lamblia NOT DETECTED NOT DETECTED Final   Adenovirus F40/41 NOT DETECTED NOT DETECTED Final   Astrovirus NOT DETECTED NOT DETECTED Final   Norovirus GI/GII NOT DETECTED NOT DETECTED Final   Rotavirus A NOT DETECTED NOT DETECTED Final   Sapovirus (I, II, IV, and V) NOT DETECTED NOT DETECTED Final    Comment: Performed at [facility], [address] [place], [place] [address]   Studies/Results: No results found. Medications:  I have reviewed the patient's current medications. Prior to Admission:  Medications Prior to Admission  Medication Sig Dispense  Refill Last Dose  . acetaminophen (TYLENOL) 325 MG tablet Take 2 tablets (650 mg total) by mouth every 6 (six) hours as needed for mild pain (or Fever >/= 101).   prn at prn  . cetirizine (ZYRTEC) 10 MG tablet Take 1 tablet by mouth daily.   Past Week at Unknown time  . predniSONE (DELTASONE) 10 MG tablet Days 1-5: Take 4 tablets (40 mg) daily Days 6-10: Take 3 tablets (30 mg) daily Days 11-15: Take 2 tablets (20 mg) daily Days 16-20: Take 1 tablets (10 mg) daily 50 tablet 0 01/27/2019 at 1100  . Vitamin D, Ergocalciferol, (DRISDOL) 1.25 MG (50000 UT) CAPS capsule Take 50,000 Units by mouth every 7 (seven) days.   Past Month at Unknown time  . Adalimumab (HUMIRA) 40 MG/0.8ML PSKT Inject 0.8 mLs (40 mg total) into the skin once. Repeat injection in 2 weeks. (Patient not taking: Reported on 01/27/2019) 2 each 0 Not Taking at Unknown time  . dicyclomine (BENTYL) 20 MG tablet Take 1 tablet (20 mg total) by mouth 3 (three) times daily as needed for spasms. 90 tablet 0     Scheduled: . enoxaparin (LOVENOX) injection  30 mg Subcutaneous Q24H  . loratadine  10 mg Oral Daily  . methylPREDNISolone (SOLU-MEDROL) injection  60 mg Intravenous Daily  . potassium chloride  40 mEq Oral BID  . sodium chloride flush  3 mL Intravenous Q12H   Continuous: . sodium chloride Stopped (01/30/19 0821)   WJX:BJYNWGPRN:sodium chloride, acetaminophen **OR** acetaminophen, ondansetron **OR** ondansetron (ZOFRAN) IV, sodium chloride flush Anti-infectives (From admission, onward)   None     Scheduled Meds: . enoxaparin (LOVENOX) injection  30 mg Subcutaneous Q24H  . loratadine  10 mg Oral Daily  . methylPREDNISolone (SOLU-MEDROL) injection  60 mg Intravenous Daily  . potassium chloride  40 mEq Oral BID  . sodium chloride flush  3 mL Intravenous Q12H   Continuous Infusions: . sodium chloride Stopped (01/30/19 0821)   PRN Meds:.sodium chloride, acetaminophen **OR** acetaminophen, ondansetron **OR** ondansetron (ZOFRAN) IV, sodium chloride flush   Assessment: Active Problems:   Ulcerative colitis, acute (HCC)  Underwent flexible sigmoidoscopy yesterday which revealed moderate to severe colitis Awaiting pathology results for HSV, CMV  Plan: Continue Solu-Medrol Diet as tolerated, lactose-free Recommend DVT prophylaxis with Lovenox 30 mg daily Check fecal calprotectin levels Awaiting TB skin test result interpretation.  Once TB test is negative and clinically not improving, recommend inpatient Remicade    LOS: 3 days   Shivani Barrantes 01/30/2019, 6:15 PM

## 2019-01-30 NOTE — Progress Notes (Signed)
[facility] at [place] NAME: Julie Bentley    MR#:  [id number]  DATE OF BIRTH:  1995-12-07  SUBJECTIVE:  CHIEF COMPLAINT:   Chief Complaint  Patient presents with  . GI Bleeding   No new complaint this morning.  Abdominal pains improved but patient had another bloody stool this morning.  Had flexible sigmoidoscopy done yesterday.  REVIEW OF SYSTEMS:  Review of Systems  Constitutional: Negative for chills and fever.  HENT: Negative for hearing loss and tinnitus.   Eyes: Negative for blurred vision and double vision.  Respiratory: Negative for cough and hemoptysis.   Cardiovascular: Negative for chest pain and palpitations.  Gastrointestinal: Positive for abdominal pain. Negative for heartburn, nausea and vomiting.  Genitourinary: Negative for dysuria and urgency.  Musculoskeletal: Negative for myalgias and neck pain.  Skin: Negative for itching and rash.  Neurological: Negative for dizziness and headaches.  Psychiatric/Behavioral: Negative for depression and hallucinations.    DRUG ALLERGIES:   Allergies  Allergen Reactions  . Gluten Meal    VITALS:  Blood pressure 105/69, pulse (!) 112, temperature 98.3 F (36.8 C), temperature source Oral, resp. rate 16, height 5' 5"  (1.651 m), weight 59 kg, last menstrual period 01/19/2019, SpO2 100 %. PHYSICAL EXAMINATION:  Physical Exam  GENERAL:  23 y.o.-year-old patient lying in the bed with no acute distress.  EYES: Pupils equal, round, reactive to light and accommodation. No scleral icterus. Extraocular muscles intact.  HEENT: Head atraumatic, normocephalic. Oropharynx and nasopharynx clear.  NECK:  Supple, no jugular venous distention. No thyroid enlargement, no tenderness.  LUNGS: Normal breath sounds bilaterally, no wheezing, rales,rhonchi or crepitation. No use of accessory muscles of respiration.  CARDIOVASCULAR: Regular rate and rhythm, S1, S2 normal. No murmurs, rubs, or gallops.   ABDOMEN: Soft, nondistended,  mild generalized tenderness but no rebound or guarding.  Bowel sounds present. No organomegaly or mass.  EXTREMITIES: No pedal edema, cyanosis, or clubbing.  NEUROLOGIC: Cranial nerves II through XII are intact. Muscle strength 5/5 in all extremities. Sensation intact. Gait not checked.  PSYCHIATRIC: The patient is alert and oriented x 3.  Normal affect and good eye contact. SKIN: No obvious rash, lesion, or ulcer. LABORATORY PANEL:  Female CBC Recent Labs  Lab 01/29/19 0631  WBC 11.2*  HGB 11.3*  HCT 34.4*  PLT 406*   ------------------------------------------------------------------------------------------------------------------ Chemistries  Recent Labs  Lab 01/27/19 1205  01/30/19 0455  NA 139   < > 136  K 3.1*   < > 3.8  CL 102   < > 102  CO2 28   < > 27  GLUCOSE 80   < > 122*  BUN 13   < > 15  CREATININE 0.84   < > 0.64  CALCIUM 8.9   < > 8.8*  MG  --    < > 2.3  AST 12*  --   --   ALT 11  --   --   ALKPHOS 39  --   --   BILITOT 0.7  --   --    < > = values in this interval not displayed.   RADIOLOGY:  No results found. ASSESSMENT AND PLAN:    1.   Acute ulcerative colitis flare - IV Solu-Medrol 40 mg twice daily - GI panel negative -C. difficile negative - Patient is being assisted as outpatient to receive Humira by Dr. Wynetta Emery, her outpatient gastroenterologist. -Patient seen by gastroenterologist Dr. Vicente Males.  Recommendation is to continue  IV Solu-Medrol.  Patient had flexible sigmoidoscopy done on 01/29/2019 which revealed left-sided colitis. Inflammation was found from the anus to the splenic flexure. This was graded as Mayo Score 3 (severe disease). Biopsied. Await pathology results. R/o HSV/CMV on biopsies. If no better in 48-76 hours from yesterday on iv steroids then will need inpatient remicaid Gluten-free diet for celiac disease.  2.  Anemia - Secondary to #1.  Hemoglobin remains fairly stable. -Continue iron  supplementation  3.  Celiac disease - Gluten-free diet  4.  Abdominal pain; improved.  Secondary to ulcerative colitis flareup - We will treat with analgesic as needed  5.  Hypokalemia Replaced  DVT prophylaxis with SCDs.  Avoiding heparin products due to bloody diarrhea  All the records are reviewed and case discussed with Care Management/Social Worker. Management plans discussed with the patient, family and they are in agreement.  CODE STATUS: Full Code  TOTAL TIME TAKING CARE OF THIS PATIENT: 33 minutes.   More than 50% of the time was spent in counseling/coordination of care: YES  POSSIBLE D/C IN 2 DAYS, DEPENDING ON CLINICAL CONDITION.   Roena Sassaman M.D on 01/30/2019 at 2:20 PM  Between 7am to 6pm - Pager - [phone]  After 6pm go to [email or web address] - Proofreader  [facility]  Office  [phone]  CC: Primary care physician; [facility]  Note: This dictation was prepared with Dragon dictation along with smaller phrase technology. Any transcriptional errors that result from this process are unintentional.

## 2019-01-31 LAB — BASIC METABOLIC PANEL
Anion gap: 7 (ref 5–15)
BUN: 17 mg/dL (ref 6–20)
CO2: 29 mmol/L (ref 22–32)
Calcium: 9.1 mg/dL (ref 8.9–10.3)
Chloride: 101 mmol/L (ref 98–111)
Creatinine, Ser: 0.74 mg/dL (ref 0.44–1.00)
GFR calc Af Amer: >60 mL/min (ref >60)
GFR calc non Af Amer: >60 mL/min (ref >60)
Glucose, Bld: 85 mg/dL (ref 70–99)
Potassium: 4.2 mmol/L (ref 3.5–5.1)
Sodium: 137 mmol/L (ref 135–145)

## 2019-01-31 LAB — CBC
HCT: 39.4 % (ref 36.0–46.0)
Hemoglobin: 12.5 g/dL (ref 12.0–15.0)
MCH: 29.2 pg (ref 26.0–34.0)
MCHC: 31.7 g/dL (ref 30.0–36.0)
MCV: 92.1 fL (ref 80.0–100.0)
Platelets: 408 10*3/uL — ABNORMAL HIGH (ref 150–400)
RBC: 4.28 MIL/uL (ref 3.87–5.11)
RDW: 12.7 % (ref 11.5–15.5)
WBC: 12 10*3/uL — ABNORMAL HIGH (ref 4.0–10.5)
nRBC: 0 % (ref 0.0–0.2)

## 2019-01-31 LAB — SEDIMENTATION RATE: Sed Rate: 18 mm/hr (ref 0–20)

## 2019-01-31 LAB — MAGNESIUM: Magnesium: 2.4 mg/dL (ref 1.7–2.4)

## 2019-01-31 MED ORDER — SODIUM CHLORIDE 0.9 % IV SOLN
7.5000 mg/kg | Freq: Once | INTRAVENOUS | Status: AC
  Administered 2019-02-01: 400 mg via INTRAVENOUS
  Filled 2019-01-31: qty 40

## 2019-01-31 NOTE — Progress Notes (Signed)
Cephas Darby, MD [address]  [place]  [place], [place] [address]  Main: [phone]  Fax: [phone] Pager: [phone]   Subjective: Symptoms unchanged, continues to have upper abdominal cramps associated with bloody diarrhea, fatigue.  Objective: Vital signs in last 24 hours: Vitals:   01/30/19 0451 01/30/19 1150 01/30/19 1931 01/31/19 0451  BP: 111/74 105/69 99/60 114/77  Pulse: 60 (!) 112 (!) 57 (!) 50  Resp: 16 16  16   Temp: 98.1 F (36.7 C) 98.3 F (36.8 C) 97.6 F (36.4 C) 98.5 F (36.9 C)  TempSrc: Oral Oral Oral Oral  SpO2: 98% 100% 100% 99%  Weight:      Height:       Weight change:   Intake/Output Summary (Last 24 hours) at 01/31/2019 1606 Last data filed at 01/31/2019 1332 Gross per 24 hour  Intake 840 ml  Output -  Net 840 ml     Exam: Heart:: Regular rate and rhythm or S1S2 present Lungs: normal and clear to auscultation Abdomen: soft, mild tenderness in upper abdomen, normal bowel sounds   Lab Results: CBC Latest Ref Rng & Units 01/31/2019 01/29/2019 01/28/2019  WBC 4.0 - 10.5 K/uL 12.0(H) 11.2(H) -  Hemoglobin 12.0 - 15.0 g/dL 12.5 11.3(L) 12.2  Hematocrit 36.0 - 46.0 % 39.4 34.4(L) 37.3  Platelets 150 - 400 K/uL 408(H) 406(H) -   CMP Latest Ref Rng & Units 01/31/2019 01/30/2019 01/29/2019  Glucose 70 - 99 mg/dL 85 122(H) 123(H)  BUN 6 - 20 mg/dL 17 15 13   Creatinine 0.44 - 1.00 mg/dL 0.74 0.64 0.66  Sodium 135 - 145 mmol/L 137 136 137  Potassium 3.5 - 5.1 mmol/L 4.2 3.8 4.4  Chloride 98 - 111 mmol/L 101 102 101  CO2 22 - 32 mmol/L 29 27 28   Calcium 8.9 - 10.3 mg/dL 9.1 8.8(L) 9.4  Total Protein 6.5 - 8.1 g/dL - - -  Total Bilirubin 0.3 - 1.2 mg/dL - - -  Alkaline Phos 38 - 126 U/L - - -  AST 15 - 41 U/L - - -  ALT 0 - 44 U/L - - -    Micro Results: Recent Results (from the past 240 hour(s))  SARS Coronavirus 2 [facility] order, Performed in [facility] hospital lab) Nasopharyngeal Nasopharyngeal Swab     Status: None   Collection Time: 01/27/19  5:06 PM   Specimen: Nasopharyngeal Swab  Result Value Ref Range Status   SARS Coronavirus 2 NEGATIVE NEGATIVE Final    Comment: (NOTE) If result is NEGATIVE SARS-CoV-2 target nucleic acids are NOT DETECTED. The SARS-CoV-2 RNA is generally detectable in upper and lower  respiratory specimens during the acute phase of infection. The lowest  concentration of SARS-CoV-2 viral copies this assay can detect is 250  copies / mL. A negative result does not preclude SARS-CoV-2 infection  and should not be used as the sole basis for treatment or other  patient management decisions.  A negative result may occur with  improper specimen collection / handling, submission of specimen other  than nasopharyngeal swab, presence of viral mutation(s) within the  areas targeted by this assay, and inadequate number of viral copies  (<250 copies / mL). A negative result must be combined with clinical  observations, patient history, and epidemiological information. If result is POSITIVE SARS-CoV-2 target nucleic acids are DETECTED. The SARS-CoV-2 RNA is generally detectable in upper and lower  respiratory specimens dur ing the acute phase of infection.  Positive  results are  indicative of active infection with SARS-CoV-2.  Clinical  correlation with patient history and other diagnostic information is  necessary to determine patient infection status.  Positive results do  not rule out bacterial infection or co-infection with other viruses. If result is PRESUMPTIVE POSTIVE SARS-CoV-2 nucleic acids MAY BE PRESENT.   A presumptive positive result was obtained on the submitted specimen  and confirmed on repeat testing.  While 2019 novel coronavirus  (SARS-CoV-2) nucleic acids may be present in the submitted sample  additional confirmatory testing may be necessary for epidemiological  and / or clinical management purposes  to differentiate between  SARS-CoV-2 and other Sarbecovirus  currently known to infect humans.  If clinically indicated additional testing with an alternate test  methodology [phone]([id number]) is advised. The SARS-CoV-2 RNA is generally  detectable in upper and lower respiratory sp ecimens during the acute  phase of infection. The expected result is Negative. Fact Sheet for Patients:  BoilerBrush.com.cyhttps://www.fda.gov/media/136312/download Fact Sheet for Healthcare Providers: https://pope.com/https://www.fda.gov/media/136313/download This test is not yet approved or cleared by the [place] FDA and has been authorized for detection and/or diagnosis of SARS-CoV-2 by FDA under an Emergency Use Authorization (EUA).  This EUA will remain in effect (meaning this test can be used) for the duration of the COVID-19 declaration under Section 564(b)(1) of the Act, 21 U.S.C. section 360bbb-3(b)(1), unless the authorization is terminated or revoked sooner. Performed at [facility], [address] [place], [place] [address]   C difficile quick scan w PCR reflex     Status: None   Collection Time: 01/27/19  5:06 PM   Specimen: STOOL  Result Value Ref Range Status   C Diff antigen NEGATIVE NEGATIVE Final   C Diff toxin NEGATIVE NEGATIVE Final   C Diff interpretation No C. difficile detected.  Final    Comment: Performed at [facility], [address] [place], [place] [address]  Gastrointestinal Panel by PCR , Stool     Status: None   Collection Time: 01/27/19  5:06 PM   Specimen: Stool  Result Value Ref Range Status   Campylobacter species NOT DETECTED NOT DETECTED Final   Plesimonas shigelloides NOT DETECTED NOT DETECTED Final   Salmonella species NOT DETECTED NOT DETECTED Final   Yersinia enterocolitica NOT DETECTED NOT DETECTED Final   Vibrio species NOT DETECTED NOT DETECTED Final   Vibrio cholerae NOT DETECTED NOT DETECTED Final   Enteroaggregative E coli (EAEC) NOT DETECTED NOT DETECTED Final   Enteropathogenic E coli (EPEC) NOT DETECTED NOT DETECTED Final    Enterotoxigenic E coli (ETEC) NOT DETECTED NOT DETECTED Final   Shiga like toxin producing E coli (STEC) NOT DETECTED NOT DETECTED Final   Shigella/Enteroinvasive E coli (EIEC) NOT DETECTED NOT DETECTED Final   Cryptosporidium NOT DETECTED NOT DETECTED Final   Cyclospora cayetanensis NOT DETECTED NOT DETECTED Final   Entamoeba histolytica NOT DETECTED NOT DETECTED Final   Giardia lamblia NOT DETECTED NOT DETECTED Final   Adenovirus F40/41 NOT DETECTED NOT DETECTED Final   Astrovirus NOT DETECTED NOT DETECTED Final   Norovirus GI/GII NOT DETECTED NOT DETECTED Final   Rotavirus A NOT DETECTED NOT DETECTED Final   Sapovirus (I, II, IV, and V) NOT DETECTED NOT DETECTED Final    Comment: Performed at [facility], [address] [place], [place] [address]   Studies/Results: No results found. Medications:  I have reviewed the patient's current medications. Prior to Admission:  Medications Prior to Admission  Medication Sig Dispense Refill Last Dose  .  acetaminophen (TYLENOL) 325 MG tablet Take 2 tablets (650 mg total) by mouth every 6 (six) hours as needed for mild pain (or Fever >/= 101).   prn at prn  . cetirizine (ZYRTEC) 10 MG tablet Take 1 tablet by mouth daily.   Past Week at Unknown time  . predniSONE (DELTASONE) 10 MG tablet Days 1-5: Take 4 tablets (40 mg) daily Days 6-10: Take 3 tablets (30 mg) daily Days 11-15: Take 2 tablets (20 mg) daily Days 16-20: Take 1 tablets (10 mg) daily 50 tablet 0 01/27/2019 at 1100  . Vitamin D, Ergocalciferol, (DRISDOL) 1.25 MG (50000 UT) CAPS capsule Take 50,000 Units by mouth every 7 (seven) days.   Past Month at Unknown time  . Adalimumab (HUMIRA) 40 MG/0.8ML PSKT Inject 0.8 mLs (40 mg total) into the skin once. Repeat injection in 2 weeks. (Patient not taking: Reported on 01/27/2019) 2 each 0 Not Taking at Unknown time  . dicyclomine (BENTYL) 20 MG tablet Take 1 tablet (20 mg total) by mouth 3 (three) times daily as needed for spasms. 90  tablet 0    Scheduled: . enoxaparin (LOVENOX) injection  30 mg Subcutaneous Q24H  . loratadine  10 mg Oral Daily  . methylPREDNISolone (SOLU-MEDROL) injection  60 mg Intravenous Daily  . potassium chloride  40 mEq Oral BID  . sodium chloride flush  3 mL Intravenous Q12H   Continuous: . sodium chloride Stopped (01/30/19 0821)  . inFLIXimab (REMICADE) infusion     ZOX:WRUEAVPRN:sodium chloride, acetaminophen **OR** acetaminophen, ondansetron **OR** ondansetron (ZOFRAN) IV, sodium chloride flush Anti-infectives (From admission, onward)   None     Scheduled Meds: . enoxaparin (LOVENOX) injection  30 mg Subcutaneous Q24H  . loratadine  10 mg Oral Daily  . methylPREDNISolone (SOLU-MEDROL) injection  60 mg Intravenous Daily  . potassium chloride  40 mEq Oral BID  . sodium chloride flush  3 mL Intravenous Q12H   Continuous Infusions: . sodium chloride Stopped (01/30/19 0821)  . inFLIXimab (REMICADE) infusion     PRN Meds:.sodium chloride, acetaminophen **OR** acetaminophen, ondansetron **OR** ondansetron (ZOFRAN) IV, sodium chloride flush   Assessment: Active Problems:   Ulcerative colitis, acute (HCC)  Underwent flexible sigmoidoscopy on 01/29/2019 which revealed moderate to severe colitis Awaiting pathology results for HSV, CMV  Plan: Continue Solu-Medrol Diet as tolerated, lactose-free Recommend DVT prophylaxis with Lovenox 30 mg daily Check fecal calprotectin levels TB test is negative, recommend inpatient remicade with accelerated protocol due to severe UC and not responding to IV steroids  Attempted to transfer patient to [facility] per patient's request as she is planning to transfer her care from [facility] to [facility] as outpatient.  She felt this would be the right opportunity to be transferred as inpatient and also in case if she needs surgery.  Patient does not need emergent colectomy at this time unless she fails Remicade  We will follow along with you   LOS: 4 days    Suella Cogar 01/31/2019, 4:06 PM

## 2019-01-31 NOTE — Progress Notes (Signed)
Patient had a TB PPD skin [facility]  I read it this morning at 8:30am and it was negative with no induration.

## 2019-01-31 NOTE — Progress Notes (Signed)
[place] [facility] [place] NAME: Julie Bentley    MR#:  [id number]  DATE OF BIRTH:  [birth year]  SUBJECTIVE:  CHIEF COMPLAINT:   Chief Complaint  Patient presents with  . GI Bleeding   Patient reported having several episodes of bloody stools this morning.  Still has some abdominal pains.  Despite being on steroids.  Patient requesting for transfer to [facility].  I have placed a call to [facility] and awaiting callback from the physician on-call  REVIEW OF SYSTEMS:  Review of Systems  Constitutional: Negative for chills and fever.  HENT: Negative for hearing loss and tinnitus.   Eyes: Negative for blurred vision and double vision.  Respiratory: Negative for cough and hemoptysis.   Cardiovascular: Negative for chest pain and palpitations.  Gastrointestinal: Positive for abdominal pain and blood in stool. Negative for heartburn, nausea and vomiting.  Genitourinary: Negative for dysuria and urgency.  Musculoskeletal: Negative for myalgias and neck pain.  Skin: Negative for itching and rash.  Neurological: Negative for dizziness and headaches.  Psychiatric/Behavioral: Negative for depression and hallucinations.    DRUG ALLERGIES:   Allergies  Allergen Reactions  . Gluten Meal    VITALS:  Blood pressure 114/77, pulse (!) 50, temperature 98.5 F (36.9 C), temperature source Oral, resp. rate 16, height 5' 5"  (1.651 m), weight 59 kg, last menstrual period 01/19/2019, SpO2 99 %. PHYSICAL EXAMINATION:  Physical Exam  GENERAL:  23 y.o.-year-old patient lying in the bed with no acute distress.  EYES: Pupils equal, round, reactive to light and accommodation. No scleral icterus. Extraocular muscles intact.  HEENT: Head atraumatic, normocephalic. Oropharynx and nasopharynx clear.  NECK:  Supple, no jugular venous distention. No thyroid enlargement, no tenderness.  LUNGS: Normal breath sounds bilaterally, no wheezing, rales,rhonchi or  crepitation. No use of accessory muscles of respiration.  CARDIOVASCULAR: Regular rate and rhythm, S1, S2 normal. No murmurs, rubs, or gallops.  ABDOMEN: Soft, nondistended,  mild generalized tenderness but no rebound or guarding.  Bowel sounds present. No organomegaly or mass.  EXTREMITIES: No pedal edema, cyanosis, or clubbing.  NEUROLOGIC: Cranial nerves II through XII are intact. Muscle strength 5/5 in all extremities. Sensation intact. Gait not checked.  PSYCHIATRIC: The patient is alert and oriented x 3.  Normal affect and good eye contact. SKIN: No obvious rash, lesion, or ulcer. LABORATORY PANEL:  Female CBC Recent Labs  Lab 01/31/19 0439  WBC 12.0*  HGB 12.5  HCT 39.4  PLT 408*   ------------------------------------------------------------------------------------------------------------------ Chemistries  Recent Labs  Lab 01/27/19 1205  01/31/19 0439  NA 139   < > 137  K 3.1*   < > 4.2  CL 102   < > 101  CO2 28   < > 29  GLUCOSE 80   < > 85  BUN 13   < > 17  CREATININE 0.84   < > 0.74  CALCIUM 8.9   < > 9.1  MG  --    < > 2.4  AST 12*  --   --   ALT 11  --   --   ALKPHOS 39  --   --   BILITOT 0.7  --   --    < > = values in this interval not displayed.   RADIOLOGY:  No results found. ASSESSMENT AND PLAN:    1.   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  Still having bloody stools with abdominal  pains despite being on IV steroids for the last several days. - GI panel negative -C. difficile negative - Patient is being assisted as outpatient to receive Humira by Dr. Wynetta Emery, her outpatient gastroenterologist. -Patient seen by gastroenterologist Dr. Vicente Males and had flexible sigmoidoscopy done on 01/29/2019 which revealed severe left-sided colitis. Inflammation was found from the anus to the splenic flexure. This was graded as Mayo Score 3 (severe disease). Biopsied. Await pathology results. R/o HSV/CMV on biopsies.  Gluten-free diet for celiac disease. Due to evidence of steroid refractory ulcerative colitis, gastroenterologist planning on initiating Remicade. Patient had a TB PPD skin [facility]    This was read by nursing staff this morning at 8:30am and it was negative with no induration.   Patient however requested for transfer to [facility] due to concern that she may end up requiring surgery which will need to be done at a tertiary center.  Her primary care physician was also already making arrangements to have her connected with the [facility] prior to this admission. I have placed a call to physician on-call at [facility].  Still awaiting callback.  Will be glad to transfer patient to [facility] if accepted and if bed available.  2.  Anemia - Secondary to #1.  Hemoglobin remains fairly stable at 12.5. -Continue iron supplementation  3.  Celiac disease - Gluten-free diet  4.  Abdominal pain; improved.  Secondary to ulcerative colitis flareup - We will treat with analgesic as needed  5.  Hypokalemia Replaced  DVT prophylaxis with SCDs.  Avoiding heparin products due to bloody diarrhea  All the records are reviewed and case discussed with Care Management/Social Worker. Management plans discussed with the patient, family and they are in agreement.  CODE STATUS: Full Code  TOTAL TIME TAKING CARE OF THIS PATIENT: 35 minutes.   More than 50% of the time was spent in counseling/coordination of care: YES  POSSIBLE D/C IN 2 DAYS, DEPENDING ON CLINICAL CONDITION.   Shammara Jarrett M.D on 01/31/2019 at 3:00 PM  Between 7am to 6pm - Pager - [phone]  After 6pm go to [email or web address] - Proofreader  [facility] [place] [facility]  Office  [phone]  CC: Primary care physician; [facility]  Note: This dictation was prepared with Dragon dictation along with smaller phrase technology. Any  transcriptional errors that result from this process are unintentional.

## 2019-02-01 LAB — CBC
HCT: 34.2 % — ABNORMAL LOW (ref 36.0–46.0)
Hemoglobin: 11.1 g/dL — ABNORMAL LOW (ref 12.0–15.0)
MCH: 29.7 pg (ref 26.0–34.0)
MCHC: 32.5 g/dL (ref 30.0–36.0)
MCV: 91.4 fL (ref 80.0–100.0)
Platelets: 356 10*3/uL (ref 150–400)
RBC: 3.74 MIL/uL — ABNORMAL LOW (ref 3.87–5.11)
RDW: 12.6 % (ref 11.5–15.5)
WBC: 11.1 10*3/uL — ABNORMAL HIGH (ref 4.0–10.5)
nRBC: 0 % (ref 0.0–0.2)

## 2019-02-01 LAB — HSV CULTURE AND TYPING

## 2019-02-01 LAB — BASIC METABOLIC PANEL
Anion gap: 7 (ref 5–15)
BUN: 12 mg/dL (ref 6–20)
CO2: 27 mmol/L (ref 22–32)
Calcium: 8.7 mg/dL — ABNORMAL LOW (ref 8.9–10.3)
Chloride: 103 mmol/L (ref 98–111)
Creatinine, Ser: 0.58 mg/dL (ref 0.44–1.00)
GFR calc Af Amer: >60 mL/min (ref >60)
GFR calc non Af Amer: >60 mL/min (ref >60)
Glucose, Bld: 84 mg/dL (ref 70–99)
Potassium: 4 mmol/L (ref 3.5–5.1)
Sodium: 137 mmol/L (ref 135–145)

## 2019-02-01 LAB — GLUCOSE, CAPILLARY: Glucose-Capillary: 136 mg/dL — ABNORMAL HIGH (ref 70–99)

## 2019-02-01 LAB — PHOSPHORUS: Phosphorus: 2.7 mg/dL (ref 2.5–4.6)

## 2019-02-01 LAB — MAGNESIUM: Magnesium: 2.2 mg/dL (ref 1.7–2.4)

## 2019-02-01 MED ORDER — ENSURE ENLIVE PO LIQD
237.0000 mL | Freq: Two times a day (BID) | ORAL | Status: DC
  Administered 2019-02-01 – 2019-02-07 (×10): 237 mL via ORAL

## 2019-02-01 MED ORDER — ADULT MULTIVITAMIN W/MINERALS CH
1.0000 | ORAL_TABLET | Freq: Every day | ORAL | Status: DC
  Administered 2019-02-01 – 2019-02-07 (×7): 1 via ORAL
  Filled 2019-02-01 (×7): qty 1

## 2019-02-01 MED ORDER — INSULIN ASPART 100 UNIT/ML ~~LOC~~ SOLN
0.0000 [IU] | Freq: Three times a day (TID) | SUBCUTANEOUS | Status: DC
  Filled 2019-02-01 (×2): qty 1

## 2019-02-01 NOTE — Progress Notes (Signed)
Julie Darby, MD [address]  [place]  [place], [place] [address]  Main: [phone]  Fax: [phone] Pager: [phone]   Subjective: She had bloody bowel movement this morning.  However, she did not have cramps with bowel movements today.  She received first dose of Remicade and tolerated it well.  She thinks her appetite is improving  Objective: Vital signs in last 24 hours: Vitals:   01/31/19 1217 01/31/19 2014 02/01/19 0522 02/01/19 1212  BP: 104/69 116/77 102/65 104/79  Pulse: 61 (!) 55 94 (!) 55  Resp:  16 17   Temp: 97.7 F (36.5 C) 98.2 F (36.8 C) 98.7 F (37.1 C) 98.5 F (36.9 C)  TempSrc: Oral Oral Oral Oral  SpO2: 99% 100% 97% 100%  Weight:      Height:       Weight change:   Intake/Output Summary (Last 24 hours) at 02/01/2019 1811 Last data filed at 02/01/2019 1600 Gross per 24 hour  Intake 730 ml  Output -  Net 730 ml     Exam: Heart:: Regular rate and rhythm or S1S2 present Lungs: normal and clear to auscultation Abdomen: soft, mild tenderness in upper abdomen, normal bowel sounds   Lab Results: CBC Latest Ref Rng & Units 02/01/2019 01/31/2019 01/29/2019  WBC 4.0 - 10.5 K/uL 11.1(H) 12.0(H) 11.2(H)  Hemoglobin 12.0 - 15.0 g/dL 11.1(L) 12.5 11.3(L)  Hematocrit 36.0 - 46.0 % 34.2(L) 39.4 34.4(L)  Platelets 150 - 400 K/uL 356 408(H) 406(H)   CMP Latest Ref Rng & Units 02/01/2019 01/31/2019 01/30/2019  Glucose 70 - 99 mg/dL 84 85 122(H)  BUN 6 - 20 mg/dL 12 17 15   Creatinine 0.44 - 1.00 mg/dL 0.58 0.74 0.64  Sodium 135 - 145 mmol/L 137 137 136  Potassium 3.5 - 5.1 mmol/L 4.0 4.2 3.8  Chloride 98 - 111 mmol/L 103 101 102  CO2 22 - 32 mmol/L 27 29 27   Calcium 8.9 - 10.3 mg/dL 8.7(L) 9.1 8.8(L)  Total Protein 6.5 - 8.1 g/dL - - -  Total Bilirubin 0.3 - 1.2 mg/dL - - -  Alkaline Phos 38 - 126 U/L - - -  AST 15 - 41 U/L - - -  ALT 0 - 44 U/L - - -    Micro Results: Recent Results (from the past 240 hour(s))  SARS Coronavirus 2 [facility]  order, Performed in [facility] hospital lab) Nasopharyngeal Nasopharyngeal Swab     Status: None   Collection Time: 01/27/19  5:06 PM   Specimen: Nasopharyngeal Swab  Result Value Ref Range Status   SARS Coronavirus 2 NEGATIVE NEGATIVE Final    Comment: (NOTE) If result is NEGATIVE SARS-CoV-2 target nucleic acids are NOT DETECTED. The SARS-CoV-2 RNA is generally detectable in upper and lower  respiratory specimens during the acute phase of infection. The lowest  concentration of SARS-CoV-2 viral copies this assay can detect is 250  copies / mL. A negative result does not preclude SARS-CoV-2 infection  and should not be used as the sole basis for treatment or other  patient management decisions.  A negative result may occur with  improper specimen collection / handling, submission of specimen other  than nasopharyngeal swab, presence of viral mutation(s) within the  areas targeted by this assay, and inadequate number of viral copies  (<250 copies / mL). A negative result must be combined with clinical  observations, patient history, and epidemiological information. If result is POSITIVE SARS-CoV-2 target nucleic acids are DETECTED. The SARS-CoV-2  RNA is generally detectable in upper and lower  respiratory specimens dur ing the acute phase of infection.  Positive  results are indicative of active infection with SARS-CoV-2.  Clinical  correlation with patient history and other diagnostic information is  necessary to determine patient infection status.  Positive results do  not rule out bacterial infection or co-infection with other viruses. If result is PRESUMPTIVE POSTIVE SARS-CoV-2 nucleic acids MAY BE PRESENT.   A presumptive positive result was obtained on the submitted specimen  and confirmed on repeat testing.  While 2019 novel coronavirus  (SARS-CoV-2) nucleic acids may be present in the submitted sample  additional confirmatory testing may be necessary for epidemiological  and  / or clinical management purposes  to differentiate between  SARS-CoV-2 and other Sarbecovirus currently known to infect humans.  If clinically indicated additional testing with an alternate test  methodology [phone]) is advised. The SARS-CoV-2 RNA is generally  detectable in upper and lower respiratory sp ecimens during the acute  phase of infection. The expected result is Negative. Fact Sheet for Patients:  StrictlyIdeas.no Fact Sheet for Healthcare Providers: BankingDealers.co.za This test is not yet approved or cleared by the [place] FDA and has been authorized for detection and/or diagnosis of SARS-CoV-2 by FDA under an Emergency Use Authorization (EUA).  This EUA will remain in effect (meaning this test can be used) for the duration of the COVID-19 declaration under Section 564(b)(1) of the Act, 21 U.S.C. section 360bbb-3(b)(1), unless the authorization is terminated or revoked sooner. Performed at [facility], [address] [place], [place] [address]   C difficile quick scan w PCR reflex     Status: None   Collection Time: 01/27/19  5:06 PM   Specimen: STOOL  Result Value Ref Range Status   C Diff antigen NEGATIVE NEGATIVE Final   C Diff toxin NEGATIVE NEGATIVE Final   C Diff interpretation No C. difficile detected.  Final    Comment: Performed at [facility], [address] [place], [place] [address]  Gastrointestinal Panel by PCR , Stool     Status: None   Collection Time: 01/27/19  5:06 PM   Specimen: Stool  Result Value Ref Range Status   Campylobacter species NOT DETECTED NOT DETECTED Final   Plesimonas shigelloides NOT DETECTED NOT DETECTED Final   Salmonella species NOT DETECTED NOT DETECTED Final   Yersinia enterocolitica NOT DETECTED NOT DETECTED Final   Vibrio species NOT DETECTED NOT DETECTED Final   Vibrio cholerae NOT DETECTED NOT DETECTED Final   Enteroaggregative E coli (EAEC)  NOT DETECTED NOT DETECTED Final   Enteropathogenic E coli (EPEC) NOT DETECTED NOT DETECTED Final   Enterotoxigenic E coli (ETEC) NOT DETECTED NOT DETECTED Final   Shiga like toxin producing E coli (STEC) NOT DETECTED NOT DETECTED Final   Shigella/Enteroinvasive E coli (EIEC) NOT DETECTED NOT DETECTED Final   Cryptosporidium NOT DETECTED NOT DETECTED Final   Cyclospora cayetanensis NOT DETECTED NOT DETECTED Final   Entamoeba histolytica NOT DETECTED NOT DETECTED Final   Giardia lamblia NOT DETECTED NOT DETECTED Final   Adenovirus F40/41 NOT DETECTED NOT DETECTED Final   Astrovirus NOT DETECTED NOT DETECTED Final   Norovirus GI/GII NOT DETECTED NOT DETECTED Final   Rotavirus A NOT DETECTED NOT DETECTED Final   Sapovirus (I, II, IV, and V) NOT DETECTED NOT DETECTED Final    Comment: Performed at [facility], [address] [place], [place] [address]  Cytomegalovirus (CMV) Culture  Status: None   Collection Time: 01/29/19  2:00 PM   Specimen: Urine  Result Value Ref Range Status   Cytomegalovirus (CMV) Culture Comment  Final    Comment: (NOTE) Preliminary Report: No Cytomegalovirus isolated at 24 hours. Next report to follow after 1 week. Performed At: [facility] [place], [place] [id number] Rush Farmer MD OA:[phone]    Source of Sample BIOPSY  Final    Comment: COLON Performed at [facility], [address] [place], [place] [address]    Studies/Results: No results found. Medications:  I have reviewed the patient's current medications. Prior to Admission:  Medications Prior to Admission  Medication Sig Dispense Refill Last Dose  . acetaminophen (TYLENOL) 325 MG tablet Take 2 tablets (650 mg total) by mouth every 6 (six) hours as needed for mild pain (or Fever >/= 101).   prn at prn  . cetirizine (ZYRTEC) 10 MG tablet Take 1 tablet by mouth daily.   Past Week at Unknown time  . predniSONE (DELTASONE) 10 MG tablet Days 1-5:  Take 4 tablets (40 mg) daily Days 6-10: Take 3 tablets (30 mg) daily Days 11-15: Take 2 tablets (20 mg) daily Days 16-20: Take 1 tablets (10 mg) daily 50 tablet 0 01/27/2019 at 1100  . Vitamin D, Ergocalciferol, (DRISDOL) 1.25 MG (50000 UT) CAPS capsule Take 50,000 Units by mouth every 7 (seven) days.   Past Month at Unknown time  . Adalimumab (HUMIRA) 40 MG/0.8ML PSKT Inject 0.8 mLs (40 mg total) into the skin once. Repeat injection in 2 weeks. (Patient not taking: Reported on 01/27/2019) 2 each 0 Not Taking at Unknown time  . dicyclomine (BENTYL) 20 MG tablet Take 1 tablet (20 mg total) by mouth 3 (three) times daily as needed for spasms. 90 tablet 0    Scheduled: . enoxaparin (LOVENOX) injection  30 mg Subcutaneous Q24H  . feeding supplement (ENSURE ENLIVE)  237 mL Oral BID BM  . loratadine  10 mg Oral Daily  . methylPREDNISolone (SOLU-MEDROL) injection  60 mg Intravenous Daily  . multivitamin with minerals  1 tablet Oral Daily  . potassium chloride  40 mEq Oral BID  . sodium chloride flush  3 mL Intravenous Q12H   Continuous: . sodium chloride Stopped (01/30/19 0821)   ATF:TDDUKG chloride, acetaminophen **OR** acetaminophen, ondansetron **OR** ondansetron (ZOFRAN) IV, sodium chloride flush Anti-infectives (From admission, onward)   None     Scheduled Meds: . enoxaparin (LOVENOX) injection  30 mg Subcutaneous Q24H  . feeding supplement (ENSURE ENLIVE)  237 mL Oral BID BM  . loratadine  10 mg Oral Daily  . methylPREDNISolone (SOLU-MEDROL) injection  60 mg Intravenous Daily  . multivitamin with minerals  1 tablet Oral Daily  . potassium chloride  40 mEq Oral BID  . sodium chloride flush  3 mL Intravenous Q12H   Continuous Infusions: . sodium chloride Stopped (01/30/19 0821)   PRN Meds:.sodium chloride, acetaminophen **OR** acetaminophen, ondansetron **OR** ondansetron (ZOFRAN) IV, sodium chloride flush   Assessment: Active Problems:   Ulcerative colitis, acute (HCC)   Underwent flexible sigmoidoscopy on 01/29/2019 which revealed moderate to severe colitis Pathology report revealed chronic moderately active colitis, no presence of inclusion bodies   Plan: Continue Solu-Medrol Diet as tolerated, lactose-free Continue DVT prophylaxis with Lovenox 30 mg daily fecal calprotectin level in process TB test is negative, hepatitis A antibody positive, hepatitis B surface antigen, E antigen and E antibody, HCV antibody negative, HIV nonreactive Patient is started on Remicade at 7.27m/kg body  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  Patient is not mounting inflammatory response with CRP and ESR, therefore not reliable  We will follow along with you.   LOS: 5 days     02/01/2019, 6:11 PM

## 2019-02-01 NOTE — Progress Notes (Signed)
Verified dose with MD, wants 7.5mg /kg for Remicade.

## 2019-02-01 NOTE — Progress Notes (Signed)
Nutrition Follow Up Note   DOCUMENTATION CODES:   Not applicable  INTERVENTION:   Ensure Enlive po BID, each supplement provides 350 kcal and 20 grams of protein  MVI daily   GI soft/Gluten free diet   NUTRITION DIAGNOSIS:   Increased nutrient needs related to catabolic illness(UC flare) as evidenced by estimated needs.  GOAL:   Patient will meet greater than or equal to 90% of their needs -progressing   MONITOR:   PO intake, Supplement acceptance, Labs, Weight trends, Skin, I & O's  ASSESSMENT:   23 year old female with PMHx of ulcerative colitis, Celiac disease admitted with acute UC flare, anemia.   Spoke with pt via phone. Pt reports improving appetite and oral intake; pt eating 80-100% of meals. Pt reports a h/o celiac disease; pt follows a gluten free diet at home. Pt is also avoiding dairy per GI MD recommendations. RD will change pt to a GI soft/guten free diet. RD will allow potatoes and fruits without the skins as pt really enjoys sweet potatoes and fresh fruit tray. Pt is willing to try Ensure as this is gluten free and lactose free. RD will also add daily MVI. Pt requesting transfer to [facility].   Medications reviewed and include: lovenox, solu-medrol, KCl  Labs reviewed: K 4.0 wnl, P 2.7 wnl, Mg 2.2 wnl Wbc- 11.1(H)  Diet Order:   Diet Order            DIET SOFT Room service appropriate? Yes; Fluid consistency: Thin  Diet effective now             EDUCATION NEEDS:   No education needs have been identified at this time  Skin:  Skin Assessment: Reviewed RN Assessment  Last BM:  8/5- type 7  Height:   Ht Readings from Last 1 Encounters:  01/27/19 5\' 5"  (1.651 m)   Weight:   Wt Readings from Last 1 Encounters:  01/27/19 59 kg   Ideal Body Weight:  56.8 kg  BMI:  Body mass index is 21.63 kg/m.  Estimated Nutritional Needs:   Kcal:  1700-1900  Protein:  80-90 grams  Fluid:  1.8-2 L/day  Koleen Distance MS, RD, LDN Pager #-  [phone] Office#- [phone] After Hours Pager: [phone]

## 2019-02-01 NOTE — Plan of Care (Signed)
Patient doing well today.  Remicade was given today with no issues.  Patient has still had bloody stools but they have been less amount than yesterday.  Tolerating her diet well.  No significant changes.

## 2019-02-01 NOTE — Progress Notes (Signed)
I talked to the [facility] and pharmacy to verify that we could give the Remicade on the unit and they stated that it was ok to give just to follow the instructions for titration.

## 2019-02-01 NOTE — Progress Notes (Signed)
[place] [facility] [place] NAME: Julie Bentley    MR#:  [id number]  DATE OF BIRTH:  10-Sep-1995  SUBJECTIVE:  CHIEF COMPLAINT:   Chief Complaint  Patient presents with  . GI Bleeding   Patient reported having only 1 bloody stool this morning at the time of my evaluation.  No fevers.  Attempted to transfer to [facility] yesterday but they had no beds and was told there was no indication for transfer at this time since patient can be managed at this facility.  Scheduled for initiation of Remicade today  REVIEW OF SYSTEMS:  Review of Systems  Constitutional: Negative for chills and fever.  HENT: Negative for hearing loss and tinnitus.   Eyes: Negative for blurred vision and double vision.  Respiratory: Negative for cough and hemoptysis.   Cardiovascular: Negative for chest pain and palpitations.  Gastrointestinal: Positive for abdominal pain and blood in stool. Negative for heartburn, nausea and vomiting.  Genitourinary: Negative for dysuria and urgency.  Musculoskeletal: Negative for myalgias and neck pain.  Skin: Negative for itching and rash.  Neurological: Negative for dizziness and headaches.  Psychiatric/Behavioral: Negative for depression and hallucinations.    DRUG ALLERGIES:   Allergies  Allergen Reactions  . Gluten Meal    VITALS:  Blood pressure 104/79, pulse (!) 55, temperature 98.5 F (36.9 C), temperature source Oral, resp. rate 17, height 5' 5"  (1.651 m), weight 59 kg, last menstrual period 01/19/2019, SpO2 100 %. PHYSICAL EXAMINATION:  Physical Exam  GENERAL:  23 y.o.-year-old patient lying in the bed with no acute distress.  EYES: Pupils equal, round, reactive to light and accommodation. No scleral icterus. Extraocular muscles intact.  HEENT: Head atraumatic, normocephalic. Oropharynx and nasopharynx clear.  NECK:  Supple, no jugular venous distention. No thyroid enlargement, no tenderness.  LUNGS: Normal breath sounds  bilaterally, no wheezing, rales,rhonchi or crepitation. No use of accessory muscles of respiration.  CARDIOVASCULAR: Regular rate and rhythm, S1, S2 normal. No murmurs, rubs, or gallops.  ABDOMEN: Soft, nondistended,  mild generalized tenderness but no rebound or guarding.  Bowel sounds present. No organomegaly or mass.  EXTREMITIES: No pedal edema, cyanosis, or clubbing.  NEUROLOGIC: Cranial nerves II through XII are intact. Muscle strength 5/5 in all extremities. Sensation intact. Gait not checked.  PSYCHIATRIC: The patient is alert and oriented x 3.  Normal affect and good eye contact. SKIN: No obvious rash, lesion, or ulcer. LABORATORY PANEL:  Female CBC Recent Labs  Lab 02/01/19 0627  WBC 11.1*  HGB 11.1*  HCT 34.2*  PLT 356   ------------------------------------------------------------------------------------------------------------------ Chemistries  Recent Labs  Lab 01/27/19 1205  02/01/19 0627  NA 139   < > 137  K 3.1*   < > 4.0  CL 102   < > 103  CO2 28   < > 27  GLUCOSE 80   < > 84  BUN 13   < > 12  CREATININE 0.84   < > 0.58  CALCIUM 8.9   < > 8.7*  MG  --    < > 2.2  AST 12*  --   --   ALT 11  --   --   ALKPHOS 39  --   --   BILITOT 0.7  --   --    < > = values in this interval not displayed.   RADIOLOGY:  No results found. ASSESSMENT AND PLAN:    1.   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  Still having bloody stools with abdominal pains despite being on IV steroids for the last several days. - GI panel negative -C. difficile negative - Patient is being assisted as outpatient to receive Humira by Dr. Wynetta Emery, her outpatient gastroenterologist. -Patient seen by gastroenterologist Dr. Vicente Males and had flexible sigmoidoscopy done on 01/29/2019 which revealed severe left-sided colitis. Inflammation was found from the anus to the splenic flexure. This was graded as Mayo Score 3 (severe disease). Biopsied.  Await pathology results. R/o HSV/CMV on biopsies. Gluten-free diet for celiac disease. Due to evidence of steroid refractory ulcerative colitis, patient is scheduled for initiation of Remicade by gastroenterologist today.  Patient had a TB PPD skin [facility]    This was read by nursing staff and it was negative with no induration. Patient had also requested for transfer to [facility] and I discussed case with [facility] 01/31/2019. I was told they had no beds.  Also no indication for transfer since patient can be managed medically with Remicade at this facility.  Patient does not have any indication for urgent surgical intervention at this time.  To follow-up with GI clinic at [facility] as outpatient post discharge from the hospital  2.  Anemia - Secondary to #1.  Hemoglobin remains fairly stable  -Continue iron supplementation  3.  Celiac disease - Gluten-free diet  4.  Abdominal pain; improved.  Secondary to ulcerative colitis flareup - We will treat with analgesic as needed  5.  Hypokalemia Replaced  DVT prophylaxis; patient on Lovenox  All the records are reviewed and case discussed with Care Management/Social Worker. Management plans discussed with the patient, family and they are in agreement.  CODE STATUS: Full Code  TOTAL TIME TAKING CARE OF THIS PATIENT: 34 minutes.   More than 50% of the time was spent in counseling/coordination of care: YES  POSSIBLE D/C IN 2 DAYS, DEPENDING ON CLINICAL CONDITION.   Mckenze Slone M.D on 02/01/2019 at 2:42 PM  Between 7am to 6pm - Pager - [phone]  After 6pm go to [email or web address] - Proofreader  [employer] [facility]  Office  [phone]  CC: Primary care physician; [facility]  Note: This dictation was prepared with Dragon dictation along with smaller phrase technology. Any transcriptional errors that result from this process are  unintentional.

## 2019-02-02 ENCOUNTER — Inpatient Hospital Stay (HOSPITAL_COMMUNITY)
Admission: EM | Admit: 2019-02-02 | Discharge: 2019-02-02 | Disposition: A | Discharge status: Home / Self Care | Payer: BC Managed Care – PPO | Source: Home / Self Care | Attending: Internal Medicine | Admitting: Internal Medicine

## 2019-02-02 DIAGNOSIS — R0600 Dyspnea, unspecified: Secondary | ICD-10-CM

## 2019-02-02 DIAGNOSIS — Z8679 Personal history of other diseases of the circulatory system: Secondary | ICD-10-CM

## 2019-02-02 DIAGNOSIS — Z7189 Other specified counseling: Secondary | ICD-10-CM

## 2019-02-02 LAB — CBC
HCT: 33.7 % — ABNORMAL LOW (ref 36.0–46.0)
Hemoglobin: 10.9 g/dL — ABNORMAL LOW (ref 12.0–15.0)
MCH: 29.3 pg (ref 26.0–34.0)
MCHC: 32.3 g/dL (ref 30.0–36.0)
MCV: 90.6 fL (ref 80.0–100.0)
Platelets: 348 10*3/uL (ref 150–400)
RBC: 3.72 MIL/uL — ABNORMAL LOW (ref 3.87–5.11)
RDW: 12.4 % (ref 11.5–15.5)
WBC: 10.4 10*3/uL (ref 4.0–10.5)
nRBC: 0 % (ref 0.0–0.2)

## 2019-02-02 LAB — GLUCOSE, CAPILLARY
Glucose-Capillary: 71 mg/dL (ref 70–99)
Glucose-Capillary: 130 mg/dL — ABNORMAL HIGH (ref 70–99)
Glucose-Capillary: 136 mg/dL — ABNORMAL HIGH (ref 70–99)
Glucose-Capillary: 92 mg/dL (ref 70–99)

## 2019-02-02 LAB — BASIC METABOLIC PANEL
Anion gap: 9 (ref 5–15)
BUN: 18 mg/dL (ref 6–20)
CO2: 26 mmol/L (ref 22–32)
Calcium: 8.8 mg/dL — ABNORMAL LOW (ref 8.9–10.3)
Chloride: 103 mmol/L (ref 98–111)
Creatinine, Ser: 0.68 mg/dL (ref 0.44–1.00)
GFR calc Af Amer: >60 mL/min (ref >60)
GFR calc non Af Amer: >60 mL/min (ref >60)
Glucose, Bld: 79 mg/dL (ref 70–99)
Potassium: 3.7 mmol/L (ref 3.5–5.1)
Sodium: 138 mmol/L (ref 135–145)

## 2019-02-02 LAB — MAGNESIUM: Magnesium: 2.2 mg/dL (ref 1.7–2.4)

## 2019-02-02 LAB — ALBUMIN: Albumin: 3.2 g/dL — ABNORMAL LOW (ref 3.5–5.0)

## 2019-02-02 LAB — ECHOCARDIOGRAM COMPLETE
Height: 65 in
Weight: 2080 oz

## 2019-02-02 MED ORDER — HEPATITIS B VAC RECOMBINANT 10 MCG/ML IJ SUSP
1.0000 mL | Freq: Once | INTRAMUSCULAR | Status: DC
  Filled 2019-02-02: qty 1

## 2019-02-02 MED ORDER — SODIUM CHLORIDE 0.9 % IV SOLN
510.0000 mg | Freq: Once | INTRAVENOUS | Status: AC
  Administered 2019-02-02: 510 mg via INTRAVENOUS
  Filled 2019-02-02: qty 17

## 2019-02-02 MED ORDER — PNEUMOCOCCAL 13-VAL CONJ VACC IM SUSP
0.5000 mL | INTRAMUSCULAR | Status: DC
  Filled 2019-02-02: qty 0.5

## 2019-02-02 NOTE — Progress Notes (Addendum)
Julie Darby, MD [address]  [place]  [place], [place] [address]  Main: [phone]  Fax: [phone] Pager: [phone]   Subjective: She continues to have bloody bowel movements, although her bowel movements are formed today.  She showed me the pictures of her BM from today.  She does report less abdominal cramps, appetite has been okay  Objective: Vital signs in last 24 hours: Vitals:   02/01/19 1212 02/01/19 2030 02/02/19 0453 02/02/19 1214  BP: 104/79 111/78 106/73 106/71  Pulse: (!) 55 67 (!) 48 83  Resp:  18 16 15   Temp: 98.5 F (36.9 C) 98.1 F (36.7 C) 98.4 F (36.9 C) 98.5 F (36.9 C)  TempSrc: Oral Oral Oral Oral  SpO2: 100% 100% 100% 99%  Weight:      Height:       Weight change:   Intake/Output Summary (Last 24 hours) at 02/02/2019 1626 Last data filed at 02/01/2019 2036 Gross per 24 hour  Intake 0 ml  Output 0 ml  Net 0 ml     Exam: Heart:: Regular rate and rhythm or S1S2 present Lungs: normal and clear to auscultation Abdomen: soft, mild tenderness in upper abdomen, normal bowel sounds   Lab Results: CBC Latest Ref Rng & Units 02/02/2019 02/01/2019 01/31/2019  WBC 4.0 - 10.5 K/uL 10.4 11.1(H) 12.0(H)  Hemoglobin 12.0 - 15.0 g/dL 10.9(L) 11.1(L) 12.5  Hematocrit 36.0 - 46.0 % 33.7(L) 34.2(L) 39.4  Platelets 150 - 400 K/uL 348 356 408(H)   CMP Latest Ref Rng & Units 02/02/2019 02/01/2019 01/31/2019  Glucose 70 - 99 mg/dL 79 84 85  BUN 6 - 20 mg/dL 18 12 17   Creatinine 0.44 - 1.00 mg/dL 0.68 0.58 0.74  Sodium 135 - 145 mmol/L 138 137 137  Potassium 3.5 - 5.1 mmol/L 3.7 4.0 4.2  Chloride 98 - 111 mmol/L 103 103 101  CO2 22 - 32 mmol/L 26 27 29   Calcium 8.9 - 10.3 mg/dL 8.8(L) 8.7(L) 9.1  Total Protein 6.5 - 8.1 g/dL - - -  Total Bilirubin 0.3 - 1.2 mg/dL - - -  Alkaline Phos 38 - 126 U/L - - -  AST 15 - 41 U/L - - -  ALT 0 - 44 U/L - - -    Micro Results: Recent Results (from the past 240 hour(s))  SARS Coronavirus 2 [facility]  order, Performed in [facility] hospital lab) Nasopharyngeal Nasopharyngeal Swab     Status: None   Collection Time: 01/27/19  5:06 PM   Specimen: Nasopharyngeal Swab  Result Value Ref Range Status   SARS Coronavirus 2 NEGATIVE NEGATIVE Final    Comment: (NOTE) If result is NEGATIVE SARS-CoV-2 target nucleic acids are NOT DETECTED. The SARS-CoV-2 RNA is generally detectable in upper and lower  respiratory specimens during the acute phase of infection. The lowest  concentration of SARS-CoV-2 viral copies this assay can detect is 250  copies / mL. A negative result does not preclude SARS-CoV-2 infection  and should not be used as the sole basis for treatment or other  patient management decisions.  A negative result may occur with  improper specimen collection / handling, submission of specimen other  than nasopharyngeal swab, presence of viral mutation(s) within the  areas targeted by this assay, and inadequate number of viral copies  (<250 copies / mL). A negative result must be combined with clinical  observations, patient history, and epidemiological information. If result is POSITIVE SARS-CoV-2 target nucleic acids are DETECTED. The  SARS-CoV-2 RNA is generally detectable in upper and lower  respiratory specimens dur ing the acute phase of infection.  Positive  results are indicative of active infection with SARS-CoV-2.  Clinical  correlation with patient history and other diagnostic information is  necessary to determine patient infection status.  Positive results do  not rule out bacterial infection or co-infection with other viruses. If result is PRESUMPTIVE POSTIVE SARS-CoV-2 nucleic acids MAY BE PRESENT.   A presumptive positive result was obtained on the submitted specimen  and confirmed on repeat testing.  While 2019 novel coronavirus  (SARS-CoV-2) nucleic acids may be present in the submitted sample  additional confirmatory testing may be necessary for epidemiological  and  / or clinical management purposes  to differentiate between  SARS-CoV-2 and other Sarbecovirus currently known to infect humans.  If clinically indicated additional testing with an alternate test  methodology [id number]) is advised. The SARS-CoV-2 RNA is generally  detectable in upper and lower respiratory sp ecimens during the acute  phase of infection. The expected result is Negative. Fact Sheet for Patients:  StrictlyIdeas.no Fact Sheet for Healthcare Providers: BankingDealers.co.za This test is not yet approved or cleared by the [place] FDA and has been authorized for detection and/or diagnosis of SARS-CoV-2 by FDA under an Emergency Use Authorization (EUA).  This EUA will remain in effect (meaning this test can be used) for the duration of the COVID-19 declaration under Section 564(b)(1) of the Act, 21 U.S.C. section 360bbb-3(b)(1), unless the authorization is terminated or revoked sooner. Performed at [facility], [address] [place], [place] [address]   C difficile quick scan w PCR reflex     Status: None   Collection Time: 01/27/19  5:06 PM   Specimen: STOOL  Result Value Ref Range Status   C Diff antigen NEGATIVE NEGATIVE Final   C Diff toxin NEGATIVE NEGATIVE Final   C Diff interpretation No C. difficile detected.  Final    Comment: Performed at [facility], [address] [place], [place] [address]  Gastrointestinal Panel by PCR , Stool     Status: None   Collection Time: 01/27/19  5:06 PM   Specimen: Stool  Result Value Ref Range Status   Campylobacter species NOT DETECTED NOT DETECTED Final   Plesimonas shigelloides NOT DETECTED NOT DETECTED Final   Salmonella species NOT DETECTED NOT DETECTED Final   Yersinia enterocolitica NOT DETECTED NOT DETECTED Final   Vibrio species NOT DETECTED NOT DETECTED Final   Vibrio cholerae NOT DETECTED NOT DETECTED Final   Enteroaggregative E coli (EAEC)  NOT DETECTED NOT DETECTED Final   Enteropathogenic E coli (EPEC) NOT DETECTED NOT DETECTED Final   Enterotoxigenic E coli (ETEC) NOT DETECTED NOT DETECTED Final   Shiga like toxin producing E coli (STEC) NOT DETECTED NOT DETECTED Final   Shigella/Enteroinvasive E coli (EIEC) NOT DETECTED NOT DETECTED Final   Cryptosporidium NOT DETECTED NOT DETECTED Final   Cyclospora cayetanensis NOT DETECTED NOT DETECTED Final   Entamoeba histolytica NOT DETECTED NOT DETECTED Final   Giardia lamblia NOT DETECTED NOT DETECTED Final   Adenovirus F40/41 NOT DETECTED NOT DETECTED Final   Astrovirus NOT DETECTED NOT DETECTED Final   Norovirus GI/GII NOT DETECTED NOT DETECTED Final   Rotavirus A NOT DETECTED NOT DETECTED Final   Sapovirus (I, II, IV, and V) NOT DETECTED NOT DETECTED Final    Comment: Performed at [facility], [address] [place], [place] [address]  Cytomegalovirus (CMV) Culture  Status: None   Collection Time: 01/29/19  2:00 PM   Specimen: Urine  Result Value Ref Range Status   Cytomegalovirus (CMV) Culture Comment  Final    Comment: (NOTE) Preliminary Report: No Cytomegalovirus isolated at 24 hours. Next report to follow after 1 week. Performed At: [facility] [place], [place] [id number] Rush Farmer MD FY:[id number]    Source of Sample BIOPSY  Final    Comment: COLON Performed at [facility], [address] [place], [place] [address]   Hsv Culture And Typing     Status: None   Collection Time: 01/29/19  2:00 PM  Result Value Ref Range Status   HSV Culture/Type Comment  Final    Comment: (NOTE) Negative No Herpes simplex virus isolated. Performed At: [facility] [place], [place] [id number] Rush Farmer MD ER:[id number]    Source of Sample ENDO  Final    Comment: Performed at [facility], [address] [place], [place] [address]   Studies/Results: No results found. Medications:   I have reviewed the patient's current medications. Prior to Admission:  Medications Prior to Admission  Medication Sig Dispense Refill Last Dose  . acetaminophen (TYLENOL) 325 MG tablet Take 2 tablets (650 mg total) by mouth every 6 (six) hours as needed for mild pain (or Fever >/= 101).   prn at prn  . cetirizine (ZYRTEC) 10 MG tablet Take 1 tablet by mouth daily.   Past Week at Unknown time  . predniSONE (DELTASONE) 10 MG tablet Days 1-5: Take 4 tablets (40 mg) daily Days 6-10: Take 3 tablets (30 mg) daily Days 11-15: Take 2 tablets (20 mg) daily Days 16-20: Take 1 tablets (10 mg) daily 50 tablet 0 01/27/2019 at 1100  . Vitamin D, Ergocalciferol, (DRISDOL) 1.25 MG (50000 UT) CAPS capsule Take 50,000 Units by mouth every 7 (seven) days.   Past Month at Unknown time  . Adalimumab (HUMIRA) 40 MG/0.8ML PSKT Inject 0.8 mLs (40 mg total) into the skin once. Repeat injection in 2 weeks. (Patient not taking: Reported on 01/27/2019) 2 each 0 Not Taking at Unknown time  . dicyclomine (BENTYL) 20 MG tablet Take 1 tablet (20 mg total) by mouth 3 (three) times daily as needed for spasms. 90 tablet 0    Scheduled: . feeding supplement (ENSURE ENLIVE)  237 mL Oral BID BM  . insulin aspart  0-9 Units Subcutaneous TID WC  . loratadine  10 mg Oral Daily  . methylPREDNISolone (SOLU-MEDROL) injection  60 mg Intravenous Daily  . multivitamin with minerals  1 tablet Oral Daily  . sodium chloride flush  3 mL Intravenous Q12H   Continuous: . sodium chloride 30 mL (02/02/19 1545)   OIZ:TIWPYK chloride, acetaminophen **OR** acetaminophen, ondansetron **OR** ondansetron (ZOFRAN) IV, sodium chloride flush Anti-infectives (From admission, onward)   None     Scheduled Meds: . feeding supplement (ENSURE ENLIVE)  237 mL Oral BID BM  . insulin aspart  0-9 Units Subcutaneous TID WC  . loratadine  10 mg Oral Daily  . methylPREDNISolone (SOLU-MEDROL) injection  60 mg Intravenous Daily  . multivitamin with minerals   1 tablet Oral Daily  . sodium chloride flush  3 mL Intravenous Q12H   Continuous Infusions: . sodium chloride 30 mL (02/02/19 1545)   PRN Meds:.sodium chloride, acetaminophen **OR** acetaminophen, ondansetron **OR** ondansetron (ZOFRAN) IV, sodium chloride flush   Assessment: Active Problems:   Ulcerative colitis, acute ([place])  Underwent flexible sigmoidoscopy on 01/29/2019 which revealed moderate  to severe colitis Pathology report revealed chronic moderately active colitis, no presence of inclusion bodies   Plan: Continue Solu-Medrol Diet as tolerated, lactose-free Okay to hold Lovenox due to rectal bleeding fecal calprotectin level in process TB test is negative, hepatitis A antibody positive, hepatitis B surface antigen, E antigen and E antibody, HCV antibody negative, HIV nonreactive.  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  Patient is not mounting inflammatory response with CRP and ESR, therefore not reliable Patient is also evaluated by cardiology for pulmonary stenosis which was in the past.  She does not have any evidence of cardiac dysfunction,Valvular abnormality based on echocardiogram today  Iron deficiency anemia secondary to rectal bleeding Ordered parenteral iron  We will follow along with you.   LOS: 6 days   Treva Huyett 02/02/2019, 4:26 PM

## 2019-02-02 NOTE — Consult Note (Signed)
Cardiology Consultation:   Patient ID: Julie Bentley MRN: [id number]; DOB: 05-17-1996  Admit date: 01/27/2019 Date of Consult: 02/02/2019  Primary Care Provider: [facility] Primary Cardiologist: No primary care provider on file. Plans to establish with [facility] in [facility]. Primary Electrophysiologist:  None    Patient Profile:   Julie Bentley is a 23 y.o. female with a hx of ulcerative colitis, celiac disease who is being seen today for the evaluation of history of pulmonic stenosis at the request of Dr. Stark Jock.  History of Present Illness:   Julie Bentley was admitted on 01/27/19 for a three week history of bloody diarrhea in the setting of ulcerative colitis and celiac disease. She requested a cardiology consult this admission. She has three concerns that she wishes to discuss: -she was started on potassium supplementation, but she has been informed that high potassium can damage the heart. -she is being started on remicade. She read that this can affect the heart, and she would like to discuss this -she has a history of reported pulmonic stenosis (reviewed below), would like to discuss how this will be monitored.  Julie Bentley and her mother are both present for interview today. 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 She had an evaluation with Dr. Raul [facility]mon[facility]) when she was in high school due to new symptoms. She had a repeat echo and wore a heart monitor, and she reports that everything was normal. She then had a repeat echo in 2016 (in [place], reviewed below). She has not seen a cardiologist in years. She would like to establish with [facility], as her GI care is also through [facility].  She denies chest pain, shortness of breath at rest or with normal exertion. No PND, orthopnea, LE edema or unexpected weight gain. No syncope. Rare occasional sensations of her heart racing.  Comments  to her questions are noted in the plan.  Heart Pathway Score:     Past Medical History:  Diagnosis Date  . Celiac disease   . UC (ulcerative colitis) [facility])     Past Surgical History:  Procedure Laterality Date  . FLEXIBLE SIGMOIDOSCOPY N/A 01/29/2019   Procedure: FLEXIBLE SIGMOIDOSCOPY;  Surgeon: Jonathon Bellows, MD;  Location: [facility] ENDOSCOPY;  Service: Gastroenterology;  Laterality: N/A;     Home Medications:  Prior to Admission medications   Medication Sig Start Date End Date Taking? Authorizing Provider  acetaminophen (TYLENOL) 325 MG tablet Take 2 tablets (650 mg total) by mouth every 6 (six) hours as needed for mild pain (or Fever >/= 101). 07/30/15  Yes Gouru, Illene Silver, MD  cetirizine (ZYRTEC) 10 MG tablet Take 1 tablet by mouth daily. 04/29/15  Yes [provider]  predniSONE (DELTASONE) 10 MG tablet Days 1-5: Take 4 tablets (40 mg) daily Days 6-10: Take 3 tablets (30 mg) daily Days 11-15: Take 2 tablets (20 mg) daily Days 16-20: Take 1 tablets (10 mg) daily 08/18/16  Yes Nance Pear, MD  Vitamin D, Ergocalciferol, (DRISDOL) 1.25 MG (50000 UT) CAPS capsule Take 50,000 Units by mouth every 7 (seven) days.   Yes [provider]  Adalimumab (HUMIRA) 40 MG/0.8ML PSKT Inject 0.8 mLs (40 mg total) into the skin once. Repeat injection in 2 weeks. Patient not taking: Reported on 01/27/2019 02/20/16 02/20/16  Victorino Dike, FNP  dicyclomine (BENTYL) 20 MG tablet Take 1 tablet (20 mg total) by mouth 3 (three) times daily as needed for spasms. 08/18/16  08/18/17  Nance Pear, MD    Inpatient Medications: Scheduled Meds: . enoxaparin (LOVENOX) injection  30 mg Subcutaneous Q24H  . feeding supplement (ENSURE ENLIVE)  237 mL Oral BID BM  . insulin aspart  0-9 Units Subcutaneous TID WC  . loratadine  10 mg Oral Daily  . methylPREDNISolone (SOLU-MEDROL) injection  60 mg Intravenous Daily  . multivitamin with minerals  1 tablet Oral Daily  . sodium chloride flush  3 mL  Intravenous Q12H   Continuous Infusions: . sodium chloride Stopped (01/30/19 0821)   PRN Meds: sodium chloride, acetaminophen **OR** acetaminophen, ondansetron **OR** ondansetron (ZOFRAN) IV, sodium chloride flush  Allergies:    Allergies  Allergen Reactions  . Gluten Meal     Social History:   Social History   Socioeconomic History  . Marital status: Single    Spouse name: Not on file  . Number of children: Not on file  . Years of education: Not on file  . Highest education level: Not on file  Occupational History  . Not on file  Social Needs  . Financial resource strain: Not on file  . Food insecurity    Worry: Not on file    Inability: Not on file  . Transportation needs    Medical: Not on file    Non-medical: Not on file  Tobacco Use  . Smoking status: Never Smoker  . Smokeless tobacco: Never Used  Substance and Sexual Activity  . Alcohol use: No  . Drug use: Not on file  . Sexual activity: Not on file  Lifestyle  . Physical activity    Days per week: Not on file    Minutes per session: Not on file  . Stress: Not on file  Relationships  . Social Herbalist on phone: Not on file    Gets together: Not on file    Attends religious service: Not on file    Active member of club or organization: Not on file    Attends meetings of clubs or organizations: Not on file    Relationship status: Not on file  . Intimate partner violence    Fear of current or ex partner: Not on file    Emotionally abused: Not on file    Physically abused: Not on file    Forced sexual activity: Not on file  Other Topics Concern  . Not on file  Social History Narrative  . Not on file    Family History:   No history of congenital heart disease that she knows of. She has a distant relative on her father's side who may also have had PS that corrected. Mother has a functional heart murmur Maternal great uncles, one with pacemaker and one with stent Maternal great aunt with  an unknown heart condition  ROS:  Please see the history of present illness.  Constitutional: Negative for chills, fever, night sweats HENT: Negative for ear pain and hearing loss.   Eyes: Negative for loss of vision and eye pain.  Respiratory: Negative for cough, sputum, wheezing.   Cardiovascular: See HPI. Gastrointestinal: Negative for abdominal pain, melena. Positive for hematochezia (steroid refractory UC).  Genitourinary: Negative for dysuria and hematuria.  Musculoskeletal: Negative for falls and myalgias.  Skin: Negative for itching and rash.  Neurological: Negative for focal weakness, focal sensory changes and loss of consciousness.  Endo/Heme/Allergies: Does not bruise/bleed easily.  All other ROS reviewed and negative.     Physical Exam/Data:   Vitals:   02/01/19  1212 02/01/19 2030 02/02/19 0453 02/02/19 1214  BP: 104/79 111/78 106/73 106/71  Pulse: (!) 55 67 (!) 48 83  Resp:  18 16 15   Temp: 98.5 F (36.9 C) 98.1 F (36.7 C) 98.4 F (36.9 C) 98.5 F (36.9 C)  TempSrc: Oral Oral Oral Oral  SpO2: 100% 100% 100% 99%  Weight:      Height:        Intake/Output Summary (Last 24 hours) at 02/02/2019 1343 Last data filed at 02/01/2019 2036 Gross per 24 hour  Intake 490 ml  Output 0 ml  Net 490 ml   Last 3 Weights 01/27/2019 08/18/2016 02/23/2016  Weight (lbs) 130 lb 125 lb 133 lb  Weight (kg) 58.968 kg 56.7 kg 60.328 kg     Body mass index is 21.63 kg/m.  General:  Well nourished, well developed, in no acute distress HEENT: normal Lymph: no adenopathy Neck: no JVD Endocrine:  No thryomegaly Vascular: RA and DP pulses 2+ bilaterally Cardiac:  normal S1, S2; RRR; 2/6 HSM Lungs:  clear to auscultation bilaterally, no wheezing, rhonchi or rales  Abd: soft, nontender, no hepatomegaly  Ext: no edema Musculoskeletal:  No deformities, BUE and BLE strength normal and equal Skin: warm and dry  Neuro:  CNs 2-12 intact, no focal abnormalities noted Psych:  Normal affect    EKG:  The EKG was personally reviewed and demonstrates:  03/21/2014 NSR Telemetry:  Telemetry was personally reviewed and demonstrates:  She is not on telemetry  Relevant CV Studies: Echo in Care Everywhere 15-Sep-2014: [place]     Morphology:Normal   Mobility:Fully mobile  LEFT VENTRICLE     Size:Normal       Anterior:Normal  Contraction:Normal        Lateral:Normal  Closest EF:>55% (Estimated)   Septal:Normal   LV Masses:No Masses       Apical:Normal      MVE:HMCN        Inferior:Normal                 Posterior:Normal Dias.FxClass:N/A  MITRAL VALVE   Leaflets:Normal       Mobility:Fully mobile  Morphology:Normal  LEFT ATRIUM     Size:Normal       LA Masses:No masses   IA Septum:Normal IAS  MAIN PA     Size:Normal  PULMONIC VALVE  Morphology:Normal       Mobility:Fully mobile  RIGHT VENTRICLE   RV Masses:No Masses        Size:MILDLY ENLARGED   Free Wall:Normal      Contraction:Normal  TRICUSPID VALVE   Leaflets:Normal       Mobility:Fully mobile  Morphology:Normal  RIGHT ATRIUM     Size:Normal       RA Other:None    RA Mass:No masses  PERICARDIUM     Fluid:No effusion  INFERIOR VENACAVA     Size:Normal Normal respiratory collapse ________________________________________________________________ DOPPLER ECHO and OTHER SPECIAL PROCEDURES   Aortic:No AR           No AS    Mitral:TRIVIAL MR         No MS      MV Inflow E Vel=nm*    MV Annulus E'Vel=nm*      E/E'Ratio=nm*  Tricuspid:MILD TR          No TS      232.0 cm/sec peak TR vel  26.5 mmHg peak RV pressure  Pulmonary:MILD PR          No PS  _________________________________________________________________________________ INTERPRETATION NORMAL LEFT VENTRICULAR SYSTOLIC FUNCTION  NORMAL RIGHT VENTRICULAR SYSTOLIC FUNCTION MILD VALVULAR REGURGITATION (See above) NO VALVULAR STENOSIS Normal pulmonary pressures  Laboratory Data:  High Sensitivity Troponin:  No results for input(s): TROPONINIHS in the last 720 hours.   Cardiac EnzymesNo results for input(s): TROPONINI in the last 168 hours. No results for input(s): TROPIPOC in the last 168 hours.  Chemistry Recent Labs  Lab 01/31/19 0439 02/01/19 0627 02/02/19 0631  NA 137 137 138  K 4.2 4.0 3.7  CL 101 103 103  CO2 29 27 26   GLUCOSE 85 84 79  BUN 17 12 18   CREATININE 0.74 0.58 0.68  CALCIUM 9.1 8.7* 8.8*  GFRNONAA >60 >60 >60  GFRAA >60 >60 >60  ANIONGAP 7 7 9     Recent Labs  Lab 01/27/19 1205 02/02/19 0631  PROT 6.7  --   ALBUMIN 3.5 3.2*  AST 12*  --   ALT 11  --   ALKPHOS 39  --   BILITOT 0.7  --    Hematology Recent Labs  Lab 01/31/19 0439 02/01/19 0627 02/02/19 0631  WBC 12.0* 11.1* 10.4  RBC 4.28 3.74* 3.72*  HGB 12.5 11.1* 10.9*  HCT 39.4 34.2* 33.7*  MCV 92.1 91.4 90.6  MCH 29.2 29.7 29.3  MCHC 31.7 32.5 32.3  RDW 12.7 12.6 12.4  PLT 408* 356 348   BNPNo results for input(s): BNP, PROBNP in the last 168 hours.  DDimer No results for input(s): DDIMER in the last 168 hours.   Radiology/Studies:  No results found.  Assessment and Plan:   Concern re: potassium supplements: K 3.1 on admission in the setting of three weeks of bloody bowel movements. Has been supplemented as needed. Not currently on standing dose. Reviewed that the cardiac risk is with very high serum potassium levels, which she has not had.  Recommended that she be followed by her PCP or GI team to determine if she needs long term supplementation. Also discussed that if she does receive long term supplementation, her potassium and kidney function will be monitored  Concern re: remicade The data around remicade's interaction with the heart largely comes from studies involving patients with known clinical  heart failure and/or reduced systolic function. She has neither of these currently.  She has already been ordered for an echocardiogram this admission, and this can serve as her baseline. She plans to establish with [facility], and long term monitoring can be incorporated into her outpatient care plan.  History of congenital PS Per most recent echo in 2016, no PS, and mild PR. RV is noted as mildly enlarged but no elevated in PA pressures Echo pending today QRS on prior ECG was only 82, none more recent. Can be repeated as an outpatient No clinical symptoms Murmur consistent with flow murmur, not suggestive of either severe PS or severe PR  Overall there is no adjustment to her planned inpatient therapy. We spoke at length about options for follow up. Her GI care is through [facility], and she would like to establish her cardiology care as well.  She is interested in a specialist in PS if possible. I did mention Dr. Vanessa Barbara at [facility], who is a congenital disease expert in cardiology and has a great deal of experience with PS and PR. She will consider this.  [facility] will sign off.   Medication Recommendations:  No change Other recommendations (labs, testing, etc):  Echo being done today Follow up as an outpatient:  She  wishes to establish with [facility], preferably congenital specialist  For questions or updates, please contact [facility] Please consult [email or web address] for contact info under   Signed, Buford Dresser, MD  02/02/2019 1:43 PM

## 2019-02-02 NOTE — Progress Notes (Signed)
*  PRELIMINARY RESULTS* Echocardiogram 2D Echocardiogram has been performed.  Julie Bentley 02/02/2019, 1:07 PM

## 2019-02-02 NOTE — Progress Notes (Signed)
[facility] [place] NAME: Julie Bentley    MR#:  [id number]  DATE OF BIRTH:  [birth year]  SUBJECTIVE:  CHIEF COMPLAINT:   Chief Complaint  Patient presents with  . GI Bleeding   Patient still reports having bloody stools.  Was s given a dose of Remicade yesterday.  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  REVIEW OF SYSTEMS:  Review of Systems  Constitutional: Negative for chills and fever.  HENT: Negative for hearing loss and tinnitus.   Eyes: Negative for blurred vision and double vision.  Respiratory: Negative for cough and hemoptysis.   Cardiovascular: Negative for chest pain and palpitations.  Gastrointestinal: Positive for blood in stool. Negative for abdominal pain, heartburn, nausea and vomiting.  Genitourinary: Negative for dysuria and urgency.  Musculoskeletal: Negative for myalgias and neck pain.  Skin: Negative for itching and rash.  Neurological: Negative for dizziness and headaches.  Psychiatric/Behavioral: Negative for depression and hallucinations.    DRUG ALLERGIES:   Allergies  Allergen Reactions  . Gluten Meal    VITALS:  Blood pressure 106/71, pulse 83, temperature 98.5 F (36.9 C), temperature source Oral, resp. rate 15, height 5' 5"  (1.651 m), weight 59 kg, last menstrual period 01/19/2019, SpO2 99 %. PHYSICAL EXAMINATION:  Physical Exam  GENERAL:  23 y.o.-year-old patient lying in the bed with no acute distress.  EYES: Pupils equal, round, reactive to light and accommodation. No scleral icterus. Extraocular muscles intact.  HEENT: Head atraumatic, normocephalic. Oropharynx and nasopharynx clear.  NECK:  Supple, no jugular venous distention. No thyroid enlargement, no tenderness.  LUNGS: Normal breath sounds bilaterally, no wheezing, rales,rhonchi or crepitation. No use of accessory muscles of  respiration.  CARDIOVASCULAR: Regular rate and rhythm, S1, S2 normal. No murmurs, rubs, or gallops.  ABDOMEN: Soft, nondistended,  mild generalized tenderness but no rebound or guarding.  Bowel sounds present. No organomegaly or mass.  EXTREMITIES: No pedal edema, cyanosis, or clubbing.  NEUROLOGIC: Cranial nerves II through XII are intact. Muscle strength 5/5 in all extremities. Sensation intact. Gait not checked.  PSYCHIATRIC: The patient is alert and oriented x 3.  Normal affect and good eye contact. SKIN: No obvious rash, lesion, or ulcer. LABORATORY PANEL:  Female CBC Recent Labs  Lab 02/02/19 0631  WBC 10.4  HGB 10.9*  HCT 33.7*  PLT 348   ------------------------------------------------------------------------------------------------------------------ Chemistries  Recent Labs  Lab 01/27/19 1205  02/02/19 0631  NA 139   < > 138  K 3.1*   < > 3.7  CL 102   < > 103  CO2 28   < > 26  GLUCOSE 80   < > 79  BUN 13   < > 18  CREATININE 0.84   < > 0.68  CALCIUM 8.9   < > 8.8*  MG  --    < > 2.2  AST 12*  --   --   ALT 11  --   --   ALKPHOS 39  --   --   BILITOT 0.7  --   --    < > = values in this interval not displayed.   RADIOLOGY:  No results found. ASSESSMENT AND PLAN:    1.   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  Still having bloody stools with abdominal pains despite being on IV steroids for  the last several days. - GI panel negative -C. difficile negative - Patient is being assisted as outpatient to receive Humira by Dr. Wynetta Emery, her outpatient gastroenterologist. -Patient seen by gastroenterologist Dr. Vicente Males and had flexible sigmoidoscopy done on 01/29/2019 which revealed severe left-sided colitis. Inflammation was found from the anus to the splenic flexure. This was graded as Mayo Score 3 (severe disease). Biopsied. Await pathology results. R/o HSV/CMV on biopsies. Gluten-free diet for celiac disease. Due  to evidence of steroid refractory ulcerative colitis, patient is scheduled for initiation of Remicade by gastroenterologist today.  Patient had a TB PPD [facility]    This was read by nursing staff and it was negative with no induration. Patient had also requested for transfer to [facility] and I discussed case with [facility] 01/31/2019. I was told they had no beds.  Also no indication for transfer since patient can be managed medically with Remicade at this facility.  Patient does not have any indication for urgent surgical intervention at this time.  To follow-up with GI clinic at [facility] as outpatient post discharge from the hospital Patient was given a dose of Remicade on 02/01/2019.  Next dose of Remicade is 02/06/2019.  Patient still having bloody stools.  2.  Anemia - Secondary to #1.  Hemoglobin remains fairly stable  -Continue iron supplementation  3.  Celiac disease - Gluten-free diet  4.  Abdominal pain; improved.  Secondary to ulcerative colitis flareup - We will treat with analgesic as needed  5.  Hypokalemia Replaced  6.  History of congenital pulmonary valve stenosis Patient had concerns and requested for cardiology consultation due to being started on Remicade recently.  Cardiology consult placed.  2D echocardiogram requested to reevaluate cardiac function and pulmonary valves.  Patient plans to establish care with [facility] cardiology post discharge from the hospital.  [facility] cardiology input.  DVT prophylaxis; Lovenox discontinued since patient still having intermittent bloody stool.  Ordered SCDs   All the records are reviewed and case discussed with Care Management/Social Worker. Management plans discussed with the patient, family and they are in agreement.  CODE STATUS: Full Code  TOTAL TIME TAKING CARE OF THIS PATIENT: 33 minutes.   More than 50% of the time was spent in counseling/coordination  of care: YES  POSSIBLE D/C IN 2 DAYS, DEPENDING ON CLINICAL CONDITION.   Orissa Arreaga M.D on 02/02/2019 at 2:22 PM  Between 7am to 6pm - Pager - [phone]  After 6pm go to [email or web address] - Proofreader  [facility]  Office  [phone]  CC: Primary care physician; [facility]  Note: This dictation was prepared with Dragon dictation along with smaller phrase technology. Any transcriptional errors that result from this process are unintentional.

## 2019-02-03 LAB — BASIC METABOLIC PANEL
Anion gap: 9 (ref 5–15)
BUN: 16 mg/dL (ref 6–20)
CO2: 28 mmol/L (ref 22–32)
Calcium: 8.8 mg/dL — ABNORMAL LOW (ref 8.9–10.3)
Chloride: 102 mmol/L (ref 98–111)
Creatinine, Ser: 0.61 mg/dL (ref 0.44–1.00)
GFR calc Af Amer: >60 mL/min (ref >60)
GFR calc non Af Amer: >60 mL/min (ref >60)
Glucose, Bld: 82 mg/dL (ref 70–99)
Potassium: 3.5 mmol/L (ref 3.5–5.1)
Sodium: 139 mmol/L (ref 135–145)

## 2019-02-03 LAB — CBC
HCT: 33.1 % — ABNORMAL LOW (ref 36.0–46.0)
Hemoglobin: 10.9 g/dL — ABNORMAL LOW (ref 12.0–15.0)
MCH: 29.3 pg (ref 26.0–34.0)
MCHC: 32.9 g/dL (ref 30.0–36.0)
MCV: 89 fL (ref 80.0–100.0)
Platelets: 376 10*3/uL (ref 150–400)
RBC: 3.72 MIL/uL — ABNORMAL LOW (ref 3.87–5.11)
RDW: 12.5 % (ref 11.5–15.5)
WBC: 11.4 10*3/uL — ABNORMAL HIGH (ref 4.0–10.5)
nRBC: 0 % (ref 0.0–0.2)

## 2019-02-03 LAB — GLUCOSE, CAPILLARY
Glucose-Capillary: 77 mg/dL (ref 70–99)
Glucose-Capillary: 121 mg/dL — ABNORMAL HIGH (ref 70–99)
Glucose-Capillary: 104 mg/dL — ABNORMAL HIGH (ref 70–99)
Glucose-Capillary: 128 mg/dL — ABNORMAL HIGH (ref 70–99)

## 2019-02-03 LAB — FOLATE: Folate: 6.1 ng/mL (ref >5.9)

## 2019-02-03 LAB — SURGICAL PATHOLOGY

## 2019-02-03 LAB — IRON AND TIBC
Iron: 498 ug/dL — ABNORMAL HIGH (ref 28–170)
Saturation Ratios: 148 % — ABNORMAL HIGH (ref 10.4–31.8)
TIBC: 337 ug/dL (ref 250–450)

## 2019-02-03 LAB — VITAMIN B12: Vitamin B-12: 277 pg/mL (ref 180–914)

## 2019-02-03 LAB — MAGNESIUM: Magnesium: 2.4 mg/dL (ref 1.7–2.4)

## 2019-02-03 MED ORDER — SODIUM CHLORIDE 0.9 % IV SOLN
10.0000 mg/kg | Freq: Once | INTRAVENOUS | Status: AC
  Administered 2019-02-06: 14:00:00 600 mg via INTRAVENOUS
  Filled 2019-02-03: qty 60

## 2019-02-03 MED ORDER — SODIUM CHLORIDE 0.9 % IV SOLN
300.0000 mg | Freq: Once | INTRAVENOUS | Status: AC
  Administered 2019-02-04: 300 mg via INTRAVENOUS
  Filled 2019-02-03: qty 15

## 2019-02-03 MED ORDER — HEPATITIS B VAC RECOMBINANT 10 MCG/0.5ML IJ SUSP
0.5000 mL | Freq: Once | INTRAMUSCULAR | Status: DC
  Filled 2019-02-03: qty 0.5

## 2019-02-03 NOTE — Progress Notes (Signed)
Cephas Darby, MD [address]  [place]  [place], [place] [address]  Main: [phone]  Fax: [phone] Pager: [phone]   Subjective: She continues to have bloody bowel movements, although her bowel movements continue to stay formed.  Energy levels are better, she denies abdominal pain, cramps.  Appetite is good.  Other than rectal bleeding, she feels 50% better clinically.  Objective: Vital signs in last 24 hours: Vitals:   02/02/19 1214 02/02/19 2020 02/03/19 0529 02/03/19 1202  BP: 106/71 98/63 111/75 107/68  Pulse: 83 (!) 55 (!) 52 73  Resp: _0 Temp: 98.5 F (36.9 C) (!) 97.3 F (36.3 C) 98 F (36.7 C) 98.7 F (37.1 C)  TempSrc: Oral Oral Oral Oral  SpO2: 99% 100% 100% 98%  Weight:      Height:       Weight change:   Intake/Output Summary (Last 24 hours) at 02/03/2019 1534 Last data filed at 02/03/2019 0745 Gross per 24 hour  Intake 147.5 ml  Output 0 ml  Net 147.5 ml     Exam: Heart:: Regular rate and rhythm or S1S2 present Lungs: normal and clear to auscultation Abdomen: soft, mild tenderness in upper abdomen, normal bowel sounds   Lab Results: CBC Latest Ref Rng & Units 02/03/2019 02/02/2019 02/01/2019  WBC 4.0 - 10.5 K/uL 11.4(H) 10.4 11.1(H)  Hemoglobin 12.0 - 15.0 g/dL 10.9(L) 10.9(L) 11.1(L)  Hematocrit 36.0 - 46.0 % 33.1(L) 33.7(L) 34.2(L)  Platelets 150 - 400 K/uL 376 348 356   CMP Latest Ref Rng & Units 02/03/2019 02/02/2019 02/01/2019  Glucose 70 - 99 mg/dL 82 79 84  BUN 6 - 20 mg/dL _1 Creatinine 0.44 - 1.00 mg/dL 0.61 0.68 0.58  Sodium 135 - 145 mmol/L 139 138 137  Potassium 3.5 - 5.1 mmol/L 3.5 3.7 4.0  Chloride 98 - 111 mmol/L 102 103 103  CO2 22 - 32 mmol/L _2 Calcium 8.9 - 10.3 mg/dL 8.8(L) 8.8(L) 8.7(L)  Total Protein 6.5 - 8.1 g/dL - - -  Total Bilirubin 0.3 - 1.2 mg/dL - - -  Alkaline Phos 38 - 126 U/L - - -  AST 15 - 41 U/L - - -  ALT 0 - 44 U/L - - -    Micro Results: Recent Results (from the  past 240 hour(s))  SARS Coronavirus 2 [facility] order, Performed in [facility] hospital lab) Nasopharyngeal Nasopharyngeal Swab     Status: None   Collection Time: 01/27/19  5:06 PM   Specimen: Nasopharyngeal Swab  Result Value Ref Range Status   SARS Coronavirus 2 NEGATIVE NEGATIVE Final    Comment: (NOTE) If result is NEGATIVE SARS-CoV-2 target nucleic acids are NOT DETECTED. The SARS-CoV-2 RNA is generally detectable in upper and lower  respiratory specimens during the acute phase of infection. The lowest  concentration of SARS-CoV-2 viral copies this assay can detect is 250  copies / mL. A negative result does not preclude SARS-CoV-2 infection  and should not be used as the sole basis for treatment or other  patient management decisions.  A negative result may occur with  improper specimen collection / handling, submission of specimen other  than nasopharyngeal swab, presence of viral mutation(s) within the  areas targeted by this assay, and inadequate number of viral copies  (<250 copies / mL). A negative result must be combined with clinical  observations, patient history, and epidemiological information. If result is POSITIVE SARS-CoV-2 target nucleic  acids are DETECTED. The SARS-CoV-2 RNA is generally detectable in upper and lower  respiratory specimens dur ing the acute phase of infection.  Positive  results are indicative of active infection with SARS-CoV-2.  Clinical  correlation with patient history and other diagnostic information is  necessary to determine patient infection status.  Positive results do  not rule out bacterial infection or co-infection with other viruses. If result is PRESUMPTIVE POSTIVE SARS-CoV-2 nucleic acids MAY BE PRESENT.   A presumptive positive result was obtained on the submitted specimen  and confirmed on repeat testing.  While 2019 novel coronavirus  (SARS-CoV-2) nucleic acids may be present in the submitted sample  additional confirmatory  testing may be necessary for epidemiological  and / or clinical management purposes  to differentiate between  SARS-CoV-2 and other Sarbecovirus currently known to infect humans.  If clinically indicated additional testing with an alternate test  methodology [phone]) is advised. The SARS-CoV-2 RNA is generally  detectable in upper and lower respiratory sp ecimens during the acute  phase of infection. The expected result is Negative. Fact Sheet for Patients:  StrictlyIdeas.no Fact Sheet for Healthcare Providers: BankingDealers.co.za This test is not yet approved or cleared by the [place] FDA and has been authorized for detection and/or diagnosis of SARS-CoV-2 by FDA under an Emergency Use Authorization (EUA).  This EUA will remain in effect (meaning this test can be used) for the duration of the COVID-19 declaration under Section 564(b)(1) of the Act, 21 U.S.C. section 360bbb-3(b)(1), unless the authorization is terminated or revoked sooner. Performed at [facility], [address] [place], [place] [address]   C difficile quick scan w PCR reflex     Status: None   Collection Time: 01/27/19  5:06 PM   Specimen: STOOL  Result Value Ref Range Status   C Diff antigen NEGATIVE NEGATIVE Final   C Diff toxin NEGATIVE NEGATIVE Final   C Diff interpretation No C. difficile detected.  Final    Comment: Performed at [facility], [address] [place], [place] [address]  Gastrointestinal Panel by PCR , Stool     Status: None   Collection Time: 01/27/19  5:06 PM   Specimen: Stool  Result Value Ref Range Status   Campylobacter species NOT DETECTED NOT DETECTED Final   Plesimonas shigelloides NOT DETECTED NOT DETECTED Final   Salmonella species NOT DETECTED NOT DETECTED Final   Yersinia enterocolitica NOT DETECTED NOT DETECTED Final   Vibrio species NOT DETECTED NOT DETECTED Final   Vibrio cholerae NOT DETECTED NOT  DETECTED Final   Enteroaggregative E coli (EAEC) NOT DETECTED NOT DETECTED Final   Enteropathogenic E coli (EPEC) NOT DETECTED NOT DETECTED Final   Enterotoxigenic E coli (ETEC) NOT DETECTED NOT DETECTED Final   Shiga like toxin producing E coli (STEC) NOT DETECTED NOT DETECTED Final   Shigella/Enteroinvasive E coli (EIEC) NOT DETECTED NOT DETECTED Final   Cryptosporidium NOT DETECTED NOT DETECTED Final   Cyclospora cayetanensis NOT DETECTED NOT DETECTED Final   Entamoeba histolytica NOT DETECTED NOT DETECTED Final   Giardia lamblia NOT DETECTED NOT DETECTED Final   Adenovirus F40/41 NOT DETECTED NOT DETECTED Final   Astrovirus NOT DETECTED NOT DETECTED Final   Norovirus GI/GII NOT DETECTED NOT DETECTED Final   Rotavirus A NOT DETECTED NOT DETECTED Final   Sapovirus (I, II, IV, and V) NOT DETECTED NOT DETECTED Final    Comment: Performed at [facility], [address], [place], [place] [address]  Cytomegalovirus (CMV)  Culture     Status: None   Collection Time: 01/29/19  2:00 PM   Specimen: Urine  Result Value Ref Range Status   Cytomegalovirus (CMV) Culture Comment  Final    Comment: (NOTE) Preliminary Report: No Cytomegalovirus isolated at 24 hours. Next report to follow after 1 week. Performed At: [facility] [place], [place] [id number] Rush Farmer MD EL:[id number]    Source of Sample BIOPSY  Final    Comment: COLON Performed at [facility], [address] [place], [place] [address]   Hsv Culture And Typing     Status: None   Collection Time: 01/29/19  2:00 PM  Result Value Ref Range Status   HSV Culture/Type Comment  Final    Comment: (NOTE) Negative No Herpes simplex virus isolated. Performed At: [facility] [place], [place] [id number] Rush Farmer MD RW:[id number]    Source of Sample ENDO  Final    Comment: Performed at [facility], [address] [place], [place] [address]    Studies/Results: No results found. Medications:  I have reviewed the patient's current medications. Prior to Admission:  Medications Prior to Admission  Medication Sig Dispense Refill Last Dose  . acetaminophen (TYLENOL) 325 MG tablet Take 2 tablets (650 mg total) by mouth every 6 (six) hours as needed for mild pain (or Fever >/= 101).   prn at prn  . cetirizine (ZYRTEC) 10 MG tablet Take 1 tablet by mouth daily.   Past Week at Unknown time  . predniSONE (DELTASONE) 10 MG tablet Days 1-5: Take 4 tablets (40 mg) daily Days 6-10: Take 3 tablets (30 mg) daily Days 11-15: Take 2 tablets (20 mg) daily Days 16-20: Take 1 tablets (10 mg) daily 50 tablet 0 01/27/2019 at 1100  . Vitamin D, Ergocalciferol, (DRISDOL) 1.25 MG (50000 UT) CAPS capsule Take 50,000 Units by mouth every 7 (seven) days.   Past Month at Unknown time  . Adalimumab (HUMIRA) 40 MG/0.8ML PSKT Inject 0.8 mLs (40 mg total) into the skin once. Repeat injection in 2 weeks. (Patient not taking: Reported on 01/27/2019) 2 each 0 Not Taking at Unknown time  . dicyclomine (BENTYL) 20 MG tablet Take 1 tablet (20 mg total) by mouth 3 (three) times daily as needed for spasms. 90 tablet 0    Scheduled: . feeding supplement (ENSURE ENLIVE)  237 mL Oral BID BM  . hepatitis b vaccine  0.5 mL Intramuscular Once  . insulin aspart  0-9 Units Subcutaneous TID WC  . loratadine  10 mg Oral Daily  . methylPREDNISolone (SOLU-MEDROL) injection  60 mg Intravenous Daily  . multivitamin with minerals  1 tablet Oral Daily  . pneumococcal 13-valent conjugate vaccine  0.5 mL Intramuscular Tomorrow-1000  . sodium chloride flush  3 mL Intravenous Q12H   Continuous: . sodium chloride Stopped (02/02/19 1637)  . [START ON 02/06/2019] inFLIXimab (REMICADE) infusion    . [START ON 02/04/2019] iron sucrose     DTO:IZTIWP chloride, acetaminophen **OR** acetaminophen, ondansetron **OR** ondansetron (ZOFRAN) IV, sodium chloride flush Anti-infectives (From admission,  onward)   None     Scheduled Meds: . feeding supplement (ENSURE ENLIVE)  237 mL Oral BID BM  . hepatitis b vaccine  0.5 mL Intramuscular Once  . insulin aspart  0-9 Units Subcutaneous TID WC  . loratadine  10 mg Oral Daily  . methylPREDNISolone (SOLU-MEDROL) injection  60 mg Intravenous Daily  . multivitamin with minerals  1 tablet Oral Daily  . pneumococcal  13-valent conjugate vaccine  0.5 mL Intramuscular Tomorrow-1000  . sodium chloride flush  3 mL Intravenous Q12H   Continuous Infusions: . sodium chloride Stopped (02/02/19 1637)  . [START ON 02/06/2019] inFLIXimab (REMICADE) infusion    . [START ON 02/04/2019] iron sucrose     PRN Meds:.sodium chloride, acetaminophen **OR** acetaminophen, ondansetron **OR** ondansetron (ZOFRAN) IV, sodium chloride flush   Assessment: Active Problems:   Ulcerative colitis, acute (HCC)  Underwent flexible sigmoidoscopy on 01/29/2019 which revealed moderate to severe colitis Pathology report revealed chronic moderately active colitis, no presence of inclusion bodies HSV and CMV negative  Plan: Continue Solu-Medrol Diet as tolerated, lactose-free Continue to hold Lovenox due to rectal bleeding fecal calprotectin level in process TB test is negative, hepatitis A antibody positive, hepatitis B surface antigen, E antigen and E antibody, HCV antibody negative, HIV nonreactive.  Check hepatitis B surface antibody, Recommend annual influenza vaccine, Pneumovax and Prevnar, HPV vaccines Patient is started on Remicade at 7.60m/kg body with intensified protocol due to steroid refractory UC, she received first dose on 02/01/2019 Next dose of Remicade should be administered on day 5, 02/06/19, recommend higher dose at 144mkg as patient demonstrates overall good clinical improvement but has persistent rectal bleeding.  If she has persistent rectal bleeding despite second dose of Remicade, [facility], monitor [facility] symptoms.   Patient is not mounting inflammatory response with CRP and ESR, therefore not reliable Patient is also evaluated by cardiology for pulmonary stenosis which was in the past.  She does not have any evidence of cardiac dysfunction,Valvular abnormality based on echocardiogram today  Iron deficiency anemia secondary to rectal bleeding Continue parenteral iron Administer Venofer on 02/04/2019  We will follow along with you.  Dr. TaBonna Gainsill cover for the weekend   LOS: 7 days   Annemarie Sebree 02/03/2019, 3:34 PM

## 2019-02-03 NOTE — Progress Notes (Signed)
[place] [facility] [place] NAME: Julie Bentley    MR#:  [id number]  DATE OF BIRTH:  [birth year]  SUBJECTIVE:  CHIEF COMPLAINT:   Chief Complaint  Patient presents with  . GI Bleeding   Patient still reports having intermittent bloody stools.  Was s given a dose of Remicade recently.  Scheduled for next dose of Remicade on 02/06/2019  REVIEW OF SYSTEMS:  Review of Systems  Constitutional: Negative for chills and fever.  HENT: Negative for hearing loss and tinnitus.   Eyes: Negative for blurred vision and double vision.  Respiratory: Negative for cough and hemoptysis.   Cardiovascular: Negative for chest pain and palpitations.  Gastrointestinal: Positive for blood in stool. Negative for abdominal pain, heartburn, nausea and vomiting.  Genitourinary: Negative for dysuria and urgency.  Musculoskeletal: Negative for myalgias and neck pain.  Skin: Negative for itching and rash.  Neurological: Negative for dizziness and headaches.  Psychiatric/Behavioral: Negative for depression and hallucinations.    DRUG ALLERGIES:   Allergies  Allergen Reactions  . Gluten Meal    VITALS:  Blood pressure 107/68, pulse 73, temperature 98.7 F (37.1 C), temperature source Oral, resp. rate 17, height 5' 5"  (1.651 m), weight 59 kg, last menstrual period 01/19/2019, SpO2 98 %. PHYSICAL EXAMINATION:  Physical Exam  GENERAL:  23 y.o.-year-old patient lying in the bed with no acute distress.  EYES: Pupils equal, round, reactive to light and accommodation. No scleral icterus. Extraocular muscles intact.  HEENT: Head atraumatic, normocephalic. Oropharynx and nasopharynx clear.  NECK:  Supple, no jugular venous distention. No thyroid enlargement, no tenderness.  LUNGS: Normal breath sounds bilaterally, no wheezing, rales,rhonchi or crepitation. No use of accessory muscles of respiration.  CARDIOVASCULAR: Regular rate and rhythm, S1, S2 normal. No murmurs, rubs, or  gallops.  ABDOMEN: Soft, nondistended,  mild generalized tenderness but no rebound or guarding.  Bowel sounds present. No organomegaly or mass.  EXTREMITIES: No pedal edema, cyanosis, or clubbing.  NEUROLOGIC: Cranial nerves II through XII are intact. Muscle strength 5/5 in all extremities. Sensation intact. Gait not checked.  PSYCHIATRIC: The patient is alert and oriented x 3.  Normal affect and good eye contact. SKIN: No obvious rash, lesion, or ulcer. LABORATORY PANEL:  Female CBC Recent Labs  Lab 02/03/19 0513  WBC 11.4*  HGB 10.9*  HCT 33.1*  PLT 376   ------------------------------------------------------------------------------------------------------------------ Chemistries  Recent Labs  Lab 02/03/19 0513  NA 139  K 3.5  CL 102  CO2 28  GLUCOSE 82  BUN 16  CREATININE 0.61  CALCIUM 8.8*  MG 2.4   RADIOLOGY:  No results found. ASSESSMENT AND PLAN:    1.   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  Still having bloody stools with abdominal pains despite being on IV steroids for the last several days. - GI panel negative -C. difficile negative - Patient was being assisted as outpatient to receive Humira by Dr. Wynetta Emery, her outpatient gastroenterologist. -Patient seen by gastroenterologist Dr. Vicente Males and had flexible sigmoidoscopy done on 01/29/2019 which revealed severe left-sided colitis. Inflammation was found from the anus to the splenic flexure. This was graded as Mayo Score 3 (severe disease). Biopsied. Await pathology results. R/o HSV/CMV on biopsies. Gluten-free diet for celiac disease. Due to evidence of steroid refractory ulcerative colitis, patient is scheduled for initiation of Remicade by gastroenterologist today.  Patient had a TB PPD skin [facility]    This was read by nursing staff and it was negative with no induration. Patient had also  requested for transfer to [facility] and I discussed case with [facility] 01/31/2019. I was told they had no beds.  Also no indication for transfer since patient can be managed medically with Remicade at this facility.  Patient does not have any indication for urgent surgical intervention at this time.  To follow-up with GI clinic at [facility] as outpatient post discharge from the hospital Patient was given a dose of Remicade on 02/01/2019.  Next dose of Remicade is 02/06/2019.  Patient still having bloody stools. Continue IV Solu-Medrol for now  2.  Anemia - Secondary to #1.  Hemoglobin remains fairly stable  -Continue iron supplementation  3.  Celiac disease - Gluten-free diet  4.  Abdominal pain; improved.  Secondary to ulcerative colitis flareup - We will treat with analgesic as needed  5.  Hypokalemia Replaced  6.  History of congenital pulmonary valve stenosis Patient had concerns and requested for cardiology consultation due to being started on Remicade recently.  2D echocardiogram done with ejection fraction greater than 65%.  Patient seen by cardiologist.  Pulmonary valve was grossly normal.  No evidence of pulmonary stenosis.   Patient plans to establish care with [facility] cardiology post discharge from the hospital.  [facility] cardiology input.  DVT prophylaxis; Lovenox discontinued previously since patient still having intermittent bloody stool.  Ordered SCDs   All the records are reviewed and case discussed with Care Management/Social Worker. Management plans discussed with the patient, family and they are in agreement.  CODE STATUS: Full Code  TOTAL TIME TAKING CARE OF THIS PATIENT: 34 minutes.   More than 50% of the time was spent in counseling/coordination of care: YES  POSSIBLE D/C IN 2 DAYS, DEPENDING ON CLINICAL CONDITION.   Karrigan Messamore M.D on 02/03/2019 at 12:42 PM  Between 7am to 6pm - Pager - [phone]  After 6pm go to [email or web address] - Proofreader   [facility]  Office  [phone]  CC: Primary care physician; [facility]  Note: This dictation was prepared with Dragon dictation along with smaller phrase technology. Any transcriptional errors that result from this process are unintentional.

## 2019-02-04 LAB — BASIC METABOLIC PANEL
Anion gap: 7 (ref 5–15)
BUN: 18 mg/dL (ref 6–20)
CO2: 29 mmol/L (ref 22–32)
Calcium: 8.5 mg/dL — ABNORMAL LOW (ref 8.9–10.3)
Chloride: 101 mmol/L (ref 98–111)
Creatinine, Ser: 0.62 mg/dL (ref 0.44–1.00)
GFR calc Af Amer: >60 mL/min (ref >60)
GFR calc non Af Amer: >60 mL/min (ref >60)
Glucose, Bld: 90 mg/dL (ref 70–99)
Potassium: 3.7 mmol/L (ref 3.5–5.1)
Sodium: 137 mmol/L (ref 135–145)

## 2019-02-04 LAB — MAGNESIUM: Magnesium: 2.4 mg/dL (ref 1.7–2.4)

## 2019-02-04 LAB — CBC
HCT: 33.8 % — ABNORMAL LOW (ref 36.0–46.0)
Hemoglobin: 11 g/dL — ABNORMAL LOW (ref 12.0–15.0)
MCH: 29.4 pg (ref 26.0–34.0)
MCHC: 32.5 g/dL (ref 30.0–36.0)
MCV: 90.4 fL (ref 80.0–100.0)
Platelets: 368 10*3/uL (ref 150–400)
RBC: 3.74 MIL/uL — ABNORMAL LOW (ref 3.87–5.11)
RDW: 12.4 % (ref 11.5–15.5)
WBC: 12 10*3/uL — ABNORMAL HIGH (ref 4.0–10.5)
nRBC: 0 % (ref 0.0–0.2)

## 2019-02-04 LAB — GLUCOSE, CAPILLARY
Glucose-Capillary: 83 mg/dL (ref 70–99)
Glucose-Capillary: 120 mg/dL — ABNORMAL HIGH (ref 70–99)
Glucose-Capillary: 160 mg/dL — ABNORMAL HIGH (ref 70–99)
Glucose-Capillary: 129 mg/dL — ABNORMAL HIGH (ref 70–99)

## 2019-02-04 LAB — HEPATITIS B SURFACE ANTIBODY, QUANTITATIVE: Hep B S AB Quant (Post): <3.1 m[IU]/mL — ABNORMAL LOW (ref >9.9)

## 2019-02-04 NOTE — Progress Notes (Signed)
[place] [facility] [place] NAME: Julie Bentley    MR#:  [id number]  DATE OF BIRTH:  14-Nov-1995  SUBJECTIVE:  CHIEF COMPLAINT:   Chief Complaint  Patient presents with  . GI Bleeding   Patient had one episode of small bloody stool this morning.  She was given a dose of Remicade recently.  Scheduled for next dose of Remicade on 02/06/2019 REVIEW OF SYSTEMS:  Review of Systems  Constitutional: Negative for chills and fever.  HENT: Negative for hearing loss and tinnitus.   Eyes: Negative for blurred vision and double vision.  Respiratory: Negative for cough and hemoptysis.   Cardiovascular: Negative for chest pain and palpitations.  Gastrointestinal: Positive for blood in stool. Negative for abdominal pain, heartburn, nausea and vomiting.  Genitourinary: Negative for dysuria and urgency.  Musculoskeletal: Negative for myalgias and neck pain.  Skin: Negative for itching and rash.  Neurological: Negative for dizziness and headaches.  Psychiatric/Behavioral: Negative for depression and hallucinations.    DRUG ALLERGIES:   Allergies  Allergen Reactions  . Gluten Meal    VITALS:  Blood pressure 102/72, pulse 71, temperature 97.6 F (36.4 C), temperature source Oral, resp. rate 16, height 5' 5"  (1.651 m), weight 59 kg, last menstrual period 01/19/2019, SpO2 97 %. PHYSICAL EXAMINATION:  Physical Exam  GENERAL:  23 y.o.-year-old patient lying in the bed with no acute distress.  EYES: Pupils equal, round, reactive to light and accommodation. No scleral icterus. Extraocular muscles intact.  HEENT: Head atraumatic, normocephalic. Oropharynx and nasopharynx clear.  NECK:  Supple, no jugular venous distention. No thyroid enlargement, no tenderness.  LUNGS: Normal breath sounds bilaterally, no wheezing, rales,rhonchi or crepitation. No use of accessory muscles of respiration.  CARDIOVASCULAR: Regular rate and rhythm, S1, S2 normal. No murmurs, rubs,  or gallops.  ABDOMEN: Soft, nondistended,  mild generalized tenderness but no rebound or guarding.  Bowel sounds present. No organomegaly or mass.  EXTREMITIES: No pedal edema, cyanosis, or clubbing.  NEUROLOGIC: Cranial nerves II through XII are intact. Muscle strength 5/5 in all extremities. Sensation intact. Gait not checked.  PSYCHIATRIC: The patient is alert and oriented x 3.  Normal affect and good eye contact. SKIN: No obvious rash, lesion, or ulcer. LABORATORY PANEL:  Female CBC Recent Labs  Lab 02/04/19 0447  WBC 12.0*  HGB 11.0*  HCT 33.8*  PLT 368   ------------------------------------------------------------------------------------------------------------------ Chemistries  Recent Labs  Lab 02/04/19 0447  NA 137  K 3.7  CL 101  CO2 29  GLUCOSE 90  BUN 18  CREATININE 0.62  CALCIUM 8.5*  MG 2.4   RADIOLOGY:  No results found. ASSESSMENT AND PLAN:    1.   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  Still having bloody stools with abdominal pains despite being on IV steroids for the last several days. - GI panel negative -C. difficile negative - Patient was being assisted as outpatient to receive Humira by Dr. Wynetta Emery, her outpatient gastroenterologist. -Patient seen by gastroenterologist Dr. Vicente Males and had flexible sigmoidoscopy done on 01/29/2019 which revealed severe left-sided colitis. Inflammation was found from the anus to the splenic flexure. This was graded as Mayo Score 3 (severe disease). Biopsied. Await pathology results. R/o HSV/CMV on biopsies. Gluten-free diet for celiac disease. Due to evidence of steroid refractory ulcerative colitis, patient is scheduled for initiation of Remicade by gastroenterologist today.  Patient had a TB PPD skin [facility]    This was read by nursing staff and it was negative with no induration. Patient had also  requested for transfer to [facility] and I discussed case with [facility] 01/31/2019. I was told they had no beds.  Also no indication for transfer since patient can be managed medically with Remicade at this facility.  Patient does not have any indication for urgent surgical intervention at this time.  To follow-up with GI clinic at [facility] as outpatient post discharge from the hospital Patient was given a dose of Remicade on 02/01/2019.  Next dose of Remicade is 02/06/2019. Continue IV Solu-Medrol for now. The patient is improving.  2.  Anemia - Secondary to #1.  Hemoglobin remains fairly stable  -Continue iron supplementation  3.  Celiac disease - Gluten-free diet  4.  Abdominal pain; improved.  Secondary to ulcerative colitis flareup Analgesic as needed  5.  Hypokalemia.  Improved with potassium supplement.  6.  History of congenital pulmonary valve stenosis Patient had concerns and requested for cardiology consultation due to being started on Remicade recently.  2D echocardiogram done with ejection fraction greater than 65%.  Patient seen by cardiologist.  Pulmonary valve was grossly normal.  No evidence of pulmonary stenosis.   Patient plans to establish care with [facility] cardiology post discharge from the hospital.  [facility] cardiology input.  DVT prophylaxis; Lovenox discontinued previously since patient still having intermittent bloody stool.  Ordered SCDs   All the records are reviewed and case discussed with Care Management/Social Worker. Management plans discussed with the patient, family and they are in agreement.  CODE STATUS: Full Code  TOTAL TIME TAKING CARE OF THIS PATIENT: 34 minutes.   More than 50% of the time was spent in counseling/coordination of care: YES  POSSIBLE D/C IN 2 DAYS, DEPENDING ON CLINICAL CONDITION.   Demetrios Loll M.D on 02/04/2019 at 3:08 PM  Between 7am to 6pm - Pager - [phone]  After 6pm go to [email or web address] - Proofreader  [facility]  Office  [phone]  CC: Primary care physician; [facility]  Note: This dictation was prepared with Dragon dictation along with smaller phrase technology. Any transcriptional errors that result from this process are unintentional.

## 2019-02-04 NOTE — Progress Notes (Signed)
Melodie BouillonVarnita Shaquile Lutze, MD [address], [address], [place], [place], [id number] [address], [address], [place], [place], [address] Phone: [phone]  Fax: [phone]   Subjective: No fever or chills.  Bowel movements are formed.  BMs have become less frequent. Continues to have some rectal bleeding.   Objective: Exam: Vital signs in last 24 hours: Vitals:   02/03/19 0529 02/03/19 1202 02/03/19 2006 02/04/19 0543  BP: 111/75 107/68 97/62 (!) 91/57  Pulse: (!) 52 73 (!) 59 60  Resp: 16 17 16 16   Temp: 98 F (36.7 C) 98.7 F (37.1 C) 98.2 F (36.8 C) 97.9 F (36.6 C)  TempSrc: Oral Oral Oral Oral  SpO2: 100% 98% 99% 99%  Weight:      Height:       Weight change:  No intake or output data in the 24 hours ending 02/04/19 1005  General: No acute distress, AAO x3 Abd: Soft, NT/ND, No HSM Skin: Warm, no rashes Neck: Supple, Trachea midline   Lab Results: Lab Results  Component Value Date   WBC 12.0 (H) 02/04/2019   HGB 11.0 (L) 02/04/2019   HCT 33.8 (L) 02/04/2019   MCV 90.4 02/04/2019   PLT 368 02/04/2019   Micro Results: Recent Results (from the past 240 hour(s))  SARS Coronavirus 2 [facility](Hospital order, Per[facility]) Nasopharyngeal Nasopharyngeal Swab     Status: None   Collection Time: 01/27/19  5:06 PM   Specimen: Nasopharyngeal Swab  Result Value Ref Range Status   SARS Coronavirus 2 NEGATIVE NEGATIVE Final    Comment: (NOTE) If result is NEGATIVE SARS-CoV-2 target nucleic acids are NOT DETECTED. The SARS-CoV-2 RNA is generally detectable in upper and lower  respiratory specimens during the acute phase of infection. The lowest  concentration of SARS-CoV-2 viral copies this assay can detect is 250  copies / mL. A negative result does not preclude SARS-CoV-2 infection  and should not be used as the sole basis for treatment or other  patient management decisions.  A negative result may occur with  improper specimen collection /  handling, submission of specimen other  than nasopharyngeal swab, presence of viral mutation(s) within the  areas targeted by this assay, and inadequate number of viral copies  (<250 copies / mL). A negative result must be combined with clinical  observations, patient history, and epidemiological information. If result is POSITIVE SARS-CoV-2 target nucleic acids are DETECTED. The SARS-CoV-2 RNA is generally detectable in upper and lower  respiratory specimens dur ing the acute phase of infection.  Positive  results are indicative of active infection with SARS-CoV-2.  Clinical  correlation with patient history and other diagnostic information is  necessary to determine patient infection status.  Positive results do  not rule out bacterial infection or co-infection with other viruses. If result is PRESUMPTIVE POSTIVE SARS-CoV-2 nucleic acids MAY BE PRESENT.   A presumptive positive result was obtained on the submitted specimen  and confirmed on repeat testing.  While 2019 novel coronavirus  (SARS-CoV-2) nucleic acids may be present in the submitted sample  additional confirmatory testing may be necessary for epidemiological  and / or clinical management purposes  to differentiate between  SARS-CoV-2 and other Sarbecovirus currently known to infect humans.  If clinically indicated additional testing with an alternate test  methodology [phone] is advised. The SARS-CoV-2 RNA is generally  detectable in upper and lower respiratory sp ecimens during the acute  phase of infection. The expected result is Negative. Fact Sheet  for Patients:  StrictlyIdeas.no Fact Sheet for Healthcare Providers: BankingDealers.co.za This test is not yet approved or cleared by the [place] FDA and has been authorized for detection and/or diagnosis of SARS-CoV-2 by FDA under an Emergency Use Authorization (EUA).  This EUA will remain in effect (meaning this  test can be used) for the duration of the COVID-19 declaration under Section 564(b)(1) of the Act, 21 U.S.C. section 360bbb-3(b)(1), unless the authorization is terminated or revoked sooner. Performed at [facility], [address] [place], [place] [address]   C difficile quick scan w PCR reflex     Status: None   Collection Time: 01/27/19  5:06 PM   Specimen: STOOL  Result Value Ref Range Status   C Diff antigen NEGATIVE NEGATIVE Final   C Diff toxin NEGATIVE NEGATIVE Final   C Diff interpretation No C. difficile detected.  Final    Comment: Performed at [facility], [address] [place], [place] [address]  Gastrointestinal Panel by PCR , Stool     Status: None   Collection Time: 01/27/19  5:06 PM   Specimen: Stool  Result Value Ref Range Status   Campylobacter species NOT DETECTED NOT DETECTED Final   Plesimonas shigelloides NOT DETECTED NOT DETECTED Final   Salmonella species NOT DETECTED NOT DETECTED Final   Yersinia enterocolitica NOT DETECTED NOT DETECTED Final   Vibrio species NOT DETECTED NOT DETECTED Final   Vibrio cholerae NOT DETECTED NOT DETECTED Final   Enteroaggregative E coli (EAEC) NOT DETECTED NOT DETECTED Final   Enteropathogenic E coli (EPEC) NOT DETECTED NOT DETECTED Final   Enterotoxigenic E coli (ETEC) NOT DETECTED NOT DETECTED Final   Shiga like toxin producing E coli (STEC) NOT DETECTED NOT DETECTED Final   Shigella/Enteroinvasive E coli (EIEC) NOT DETECTED NOT DETECTED Final   Cryptosporidium NOT DETECTED NOT DETECTED Final   Cyclospora cayetanensis NOT DETECTED NOT DETECTED Final   Entamoeba histolytica NOT DETECTED NOT DETECTED Final   Giardia lamblia NOT DETECTED NOT DETECTED Final   Adenovirus F40/41 NOT DETECTED NOT DETECTED Final   Astrovirus NOT DETECTED NOT DETECTED Final   Norovirus GI/GII NOT DETECTED NOT DETECTED Final   Rotavirus A NOT DETECTED NOT DETECTED Final   Sapovirus (I, II, IV, and V) NOT DETECTED NOT  DETECTED Final    Comment: Performed at [facility], [address] [place], [place] [address]  Cytomegalovirus (CMV) Culture     Status: None   Collection Time: 01/29/19  2:00 PM   Specimen: Urine  Result Value Ref Range Status   Cytomegalovirus (CMV) Culture Comment  Final    Comment: (NOTE) Preliminary Report: No Cytomegalovirus isolated at 24 hours. Next report to follow after 1 week. Performed At: [facility] [place], [place] [id number] Rush Farmer MD OZ:[id number]    Source of Sample BIOPSY  Final    Comment: COLON Performed at [facility], [address] [place], [place] [address]   Hsv Culture And Typing     Status: None   Collection Time: 01/29/19  2:00 PM  Result Value Ref Range Status   HSV Culture/Type Comment  Final    Comment: (NOTE) Negative No Herpes simplex virus isolated. Performed At: [facility] [place], [place] [id number] Rush Farmer MD UU:[id number]    Source of Sample ENDO  Final    Comment: Performed at [facility], [address] [place],  [address]   Studies/Results: No results found. Medications:  Scheduled Meds: . feeding supplement (ENSURE ENLIVE)  237 mL Oral BID BM  . hepatitis b vaccine  0.5 mL Intramuscular Once  . insulin aspart  0-9 Units Subcutaneous TID WC  . loratadine  10 mg Oral Daily  . methylPREDNISolone (SOLU-MEDROL) injection  60 mg Intravenous Daily  . multivitamin with minerals  1 tablet Oral Daily  . pneumococcal 13-valent conjugate vaccine  0.5 mL Intramuscular Tomorrow-1000  . sodium chloride flush  3 mL Intravenous Q12H   Continuous Infusions: . sodium chloride Stopped (02/02/19 1637)  . [START ON 02/06/2019] inFLIXimab (REMICADE) infusion    . iron sucrose     PRN Meds:.sodium chloride, acetaminophen **OR** acetaminophen, ondansetron **OR** ondansetron (ZOFRAN) IV, sodium chloride flush   Assessment: Active Problems:    Ulcerative colitis, acute (HCC)    Plan: Patient received dose of Remicade on 02/01/2019.  Next dose to be on 02/06/2019.  Continue close monitoring, patient is improving at this time.  Close follow-up in GI clinic as an outpatient as well.   LOS: 8 days   Melodie BouillonVarnita Joshau Code, MD 02/04/2019, 10:05 AM

## 2019-02-05 LAB — GLUCOSE, CAPILLARY
Glucose-Capillary: 81 mg/dL (ref 70–99)
Glucose-Capillary: 105 mg/dL — ABNORMAL HIGH (ref 70–99)
Glucose-Capillary: 131 mg/dL — ABNORMAL HIGH (ref 70–99)
Glucose-Capillary: 107 mg/dL — ABNORMAL HIGH (ref 70–99)

## 2019-02-05 NOTE — Progress Notes (Signed)
Julie Antigua, MD [address], [place], [place], [place], [address] [address], [place], [place], [place], [address] Phone: [phone]  Fax: [phone]   Subjective:  Patient continues to clinically improve.  Frequency of bowel movement has been decreasing.  However, still continues to have some blood in stool.  No fever or chills.  Objective: Exam: Vital signs in last 24 hours: Vitals:   02/04/19 1157 02/04/19 2030 02/05/19 0422 02/05/19 1200  BP: 102/72 119/73 (!) 114/59 126/90  Pulse: 71 66 70 98  Resp: 16 20 18 15   Temp: 97.6 F (36.4 C) 98.4 F (36.9 C) 98.9 F (37.2 C) 98.6 F (37 C)  TempSrc: Oral Oral Oral Oral  SpO2: 97% 100% 98% 99%  Weight:      Height:       Weight change:   Intake/Output Summary (Last 24 hours) at 02/05/2019 1203 Last data filed at 02/05/2019 0555 Gross per 24 hour  Intake 265.95 ml  Output 0 ml  Net 265.95 ml    General: No acute distress, AAO x3 Abd: Soft, NT/ND, No HSM Skin: Warm, no rashes Neck: Supple, Trachea midline   Lab Results: Lab Results  Component Value Date   WBC 12.0 (H) 02/04/2019   HGB 11.0 (L) 02/04/2019   HCT 33.8 (L) 02/04/2019   MCV 90.4 02/04/2019   PLT 368 02/04/2019   Micro Results: Recent Results (from the past 240 hour(s))  SARS Coronavirus 2 [facility] order, Performed in [facility] hospital lab) Nasopharyngeal Nasopharyngeal Swab     Status: None   Collection Time: 01/27/19  5:06 PM   Specimen: Nasopharyngeal Swab  Result Value Ref Range Status   SARS Coronavirus 2 NEGATIVE NEGATIVE Final    Comment: (NOTE) If result is NEGATIVE SARS-CoV-2 target nucleic acids are NOT DETECTED. The SARS-CoV-2 RNA is generally detectable in upper and lower  respiratory specimens during the acute phase of infection. The lowest  concentration of SARS-CoV-2 viral copies this assay can detect is 250  copies / mL. A negative result does not preclude SARS-CoV-2 infection  and should not be used as the  sole basis for treatment or other  patient management decisions.  A negative result may occur with  improper specimen collection / handling, submission of specimen other  than nasopharyngeal swab, presence of viral mutation(s) within the  areas targeted by this assay, and inadequate number of viral copies  (<250 copies / mL). A negative result must be combined with clinical  observations, patient history, and epidemiological information. If result is POSITIVE SARS-CoV-2 target nucleic acids are DETECTED. The SARS-CoV-2 RNA is generally detectable in upper and lower  respiratory specimens dur ing the acute phase of infection.  Positive  results are indicative of active infection with SARS-CoV-2.  Clinical  correlation with patient history and other diagnostic information is  necessary to determine patient infection status.  Positive results do  not rule out bacterial infection or co-infection with other viruses. If result is PRESUMPTIVE POSTIVE SARS-CoV-2 nucleic acids MAY BE PRESENT.   A presumptive positive result was obtained on the submitted specimen  and confirmed on repeat testing.  While 2019 novel coronavirus  (SARS-CoV-2) nucleic acids may be present in the submitted sample  additional confirmatory testing may be necessary for epidemiological  and / or clinical management purposes  to differentiate between  SARS-CoV-2 and other Sarbecovirus currently known to infect humans.  If clinically indicated additional testing with an alternate test  methodology [phone]) is advised. The SARS-CoV-2  RNA is generally  detectable in upper and lower respiratory sp ecimens during the acute  phase of infection. The expected result is Negative. Fact Sheet for Patients:  BoilerBrush.com.cyhttps://www.fda.gov/media/136312/download Fact Sheet for Healthcare Providers: https://pope.com/https://www.fda.gov/media/136313/download This test is not yet approved or cleared by the [place] FDA and has been authorized for detection  and/or diagnosis of SARS-CoV-2 by FDA under an Emergency Use Authorization (EUA).  This EUA will remain in effect (meaning this test can be used) for the duration of the COVID-19 declaration under Section 564(b)(1) of the Act, 21 U.S.C. section 360bbb-3(b)(1), unless the authorization is terminated or revoked sooner. Performed at [facility], [address] [place], [place] [address]   C difficile quick scan w PCR reflex     Status: None   Collection Time: 01/27/19  5:06 PM   Specimen: STOOL  Result Value Ref Range Status   C Diff antigen NEGATIVE NEGATIVE Final   C Diff toxin NEGATIVE NEGATIVE Final   C Diff interpretation No C. difficile detected.  Final    Comment: Performed at [facility], [address] [place], [place] [address]  Gastrointestinal Panel by PCR , Stool     Status: None   Collection Time: 01/27/19  5:06 PM   Specimen: Stool  Result Value Ref Range Status   Campylobacter species NOT DETECTED NOT DETECTED Final   Plesimonas shigelloides NOT DETECTED NOT DETECTED Final   Salmonella species NOT DETECTED NOT DETECTED Final   Yersinia enterocolitica NOT DETECTED NOT DETECTED Final   Vibrio species NOT DETECTED NOT DETECTED Final   Vibrio cholerae NOT DETECTED NOT DETECTED Final   Enteroaggregative E coli (EAEC) NOT DETECTED NOT DETECTED Final   Enteropathogenic E coli (EPEC) NOT DETECTED NOT DETECTED Final   Enterotoxigenic E coli (ETEC) NOT DETECTED NOT DETECTED Final   Shiga like toxin producing E coli (STEC) NOT DETECTED NOT DETECTED Final   Shigella/Enteroinvasive E coli (EIEC) NOT DETECTED NOT DETECTED Final   Cryptosporidium NOT DETECTED NOT DETECTED Final   Cyclospora cayetanensis NOT DETECTED NOT DETECTED Final   Entamoeba histolytica NOT DETECTED NOT DETECTED Final   Giardia lamblia NOT DETECTED NOT DETECTED Final   Adenovirus F40/41 NOT DETECTED NOT DETECTED Final   Astrovirus NOT DETECTED NOT DETECTED Final   Norovirus GI/GII NOT  DETECTED NOT DETECTED Final   Rotavirus A NOT DETECTED NOT DETECTED Final   Sapovirus (I, II, IV, and V) NOT DETECTED NOT DETECTED Final    Comment: Performed at [facility], [address] [place], [place] [address]  Cytomegalovirus (CMV) Culture     Status: None   Collection Time: 01/29/19  2:00 PM   Specimen: Urine  Result Value Ref Range Status   Cytomegalovirus (CMV) Culture Comment  Final    Comment: (NOTE) Preliminary Report: No Cytomegalovirus isolated at 24 hours. Next report to follow after 1 week. Performed At: [facility] [place] [address] [place], [place] [id number] Jolene SchimkeNagendra Sanjai MD LK:[id number]:[phone]    Source of Sample BIOPSY  Final    Comment: COLON Performed at [facility], [address] [place], [place] [address]   Hsv Culture And Typing     Status: None   Collection Time: 01/29/19  2:00 PM  Result Value Ref Range Status   HSV Culture/Type Comment  Final    Comment: (NOTE) Negative No Herpes simplex virus isolated. Performed At: [facility]  [address] [place], [place] [id number] Jolene SchimkeNagendra Sanjai MD LO:[id number]:[phone]    Source of Sample ENDO  Final    Comment: Performed at [facility], [address] [place], [place] [address]   Studies/Results: No results found. Medications:  Scheduled Meds: . feeding supplement (ENSURE ENLIVE)  237 mL Oral BID BM  . hepatitis b vaccine  0.5 mL Intramuscular Once  . insulin aspart  0-9 Units Subcutaneous TID WC  . loratadine  10 mg Oral Daily  . methylPREDNISolone (SOLU-MEDROL) injection  60 mg Intravenous Daily  . multivitamin with minerals  1 tablet Oral Daily  . pneumococcal 13-valent conjugate vaccine  0.5 mL Intramuscular Tomorrow-1000  . sodium chloride flush  3 mL Intravenous Q12H   Continuous Infusions: . sodium chloride Stopped (02/02/19 1637)  . [START ON 02/06/2019] inFLIXimab (REMICADE) infusion     PRN Meds:.sodium chloride, acetaminophen **OR**  acetaminophen, ondansetron **OR** ondansetron (ZOFRAN) IV, sodium chloride flush   Assessment: Active Problems:   Ulcerative colitis, acute (HCC)    Plan: Patient continues to clinically improve Next Remicade dose is scheduled for tomorrow  Patient to follow-up in GI clinic closely on discharge  Dr. Servando SnareWohl will be following the patient starting tomorrow   LOS: 9 days   Melodie BouillonVarnita Delano Scardino, MD 02/05/2019, 12:03 PM

## 2019-02-05 NOTE — Progress Notes (Signed)
[place] at [place] NAME: Julie Bentley    MR#:  [id number]  DATE OF BIRTH:  02/11/1996  SUBJECTIVE:  CHIEF COMPLAINT:   Chief Complaint  Patient presents with  . GI Bleeding   Patient had 2 episode of small bloody stool this morning.  She was given a dose of Remicade recently.  Scheduled for next dose of Remicade on 02/06/2019 REVIEW OF SYSTEMS:  Review of Systems  Constitutional: Negative for chills and fever.  HENT: Negative for hearing loss and tinnitus.   Eyes: Negative for blurred vision and double vision.  Respiratory: Negative for cough and hemoptysis.   Cardiovascular: Negative for chest pain and palpitations.  Gastrointestinal: Positive for blood in stool. Negative for abdominal pain, heartburn, nausea and vomiting.  Genitourinary: Negative for dysuria and urgency.  Musculoskeletal: Negative for myalgias and neck pain.  Skin: Negative for itching and rash.  Neurological: Negative for dizziness and headaches.  Psychiatric/Behavioral: Negative for depression and hallucinations.    DRUG ALLERGIES:   Allergies  Allergen Reactions  . Gluten Meal    VITALS:  Blood pressure 126/90, pulse 98, temperature 98.6 F (37 C), temperature source Oral, resp. rate 15, height 5' 5"  (1.651 m), weight 59 kg, last menstrual period 01/19/2019, SpO2 99 %. PHYSICAL EXAMINATION:  Physical Exam  GENERAL:  23 y.o.-year-old patient lying in the bed with no acute distress.  EYES: Pupils equal, round, reactive to light and accommodation. No scleral icterus. Extraocular muscles intact.  HEENT: Head atraumatic, normocephalic. Oropharynx and nasopharynx clear.  NECK:  Supple, no jugular venous distention. No thyroid enlargement, no tenderness.  LUNGS: Normal breath sounds bilaterally, no wheezing, rales,rhonchi or crepitation. No use of accessory muscles of respiration.  CARDIOVASCULAR: Regular rate and rhythm, S1, S2 normal. No murmurs, rubs, or  gallops.  ABDOMEN: Soft, nondistended,  mild generalized tenderness but no rebound or guarding.  Bowel sounds present. No organomegaly or mass.  EXTREMITIES: No pedal edema, cyanosis, or clubbing.  NEUROLOGIC: Cranial nerves II through XII are intact. Muscle strength 5/5 in all extremities. Sensation intact. Gait not checked.  PSYCHIATRIC: The patient is alert and oriented x 3.  Normal affect and good eye contact. SKIN: No obvious rash, lesion, or ulcer. LABORATORY PANEL:  Female CBC Recent Labs  Lab 02/04/19 0447  WBC 12.0*  HGB 11.0*  HCT 33.8*  PLT 368   ------------------------------------------------------------------------------------------------------------------ Chemistries  Recent Labs  Lab 02/04/19 0447  NA 137  K 3.7  CL 101  CO2 29  GLUCOSE 90  BUN 18  CREATININE 0.62  CALCIUM 8.5*  MG 2.4   RADIOLOGY:  No results found. ASSESSMENT AND PLAN:    1.   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  Still having bloody stools with abdominal pains despite being on IV steroids for the last several days. - GI panel negative -C. difficile negative - Patient was being assisted as outpatient to receive Humira by Dr. Wynetta Emery, her outpatient gastroenterologist. -Patient seen by gastroenterologist Dr. Vicente Males and had flexible sigmoidoscopy done on 01/29/2019 which revealed severe left-sided colitis. Inflammation was found from the anus to the splenic flexure. This was graded as Mayo Score 3 (severe disease). Biopsied. Await pathology results. R/o HSV/CMV on biopsies. Gluten-free diet for celiac disease. Due to evidence of steroid refractory ulcerative colitis, patient is scheduled for initiation of Remicade by gastroenterologist today.  Patient had a TB PPD [facility]    This was read by nursing staff and it was negative with no induration. Patient had also  requested for transfer to [facility] and I discussed case with [facility] 01/31/2019. I was told they had no beds.  Also no indication for transfer since patient can be managed medically with Remicade at this facility.  Patient does not have any indication for urgent surgical intervention at this time.  To follow-up with GI clinic at [facility] as outpatient post discharge from the hospital Patient was given a dose of Remicade on 02/01/2019.  Next dose of Remicade is 02/06/2019. Continue IV Solu-Medrol for now. The patient is improving.  Follow-up with GI as outpatient after discharge.  2.  Anemia - Secondary to #1.  Hemoglobin remains fairly stable  -Continue iron supplementation  3.  Celiac disease - Gluten-free diet  4.  Abdominal pain; improved.  Secondary to ulcerative colitis flareup Analgesic as needed  5.  Hypokalemia.  Improved with potassium supplement.  6.  History of congenital pulmonary valve stenosis Patient had concerns and requested for cardiology consultation due to being started on Remicade recently.  2D echocardiogram done with ejection fraction greater than 65%.  Patient seen by cardiologist.  Pulmonary valve was grossly normal.  No evidence of pulmonary stenosis.   Patient plans to establish care with [facility] cardiology post discharge from the hospital.  [place] cardiology input.  DVT prophylaxis; Lovenox discontinued previously since patient still having intermittent bloody stool.  Ordered SCDs   All the records are reviewed and case discussed with Care Management/Social Worker. Management plans discussed with the patient, family and they are in agreement.  CODE STATUS: Full Code  TOTAL TIME TAKING CARE OF THIS PATIENT: 34 minutes.   More than 50% of the time was spent in counseling/coordination of care: YES  POSSIBLE D/C IN 2 DAYS, DEPENDING ON CLINICAL CONDITION.   Demetrios Loll M.D on 02/05/2019 at 2:11 PM  Between 7am to 6pm - Pager - [phone]  After 6pm  go to [email or web address] - Proofreader  [facility] [place] [facility]  Office  [phone]  CC: Primary care physician; [facility]  Note: This dictation was prepared with Dragon dictation along with smaller phrase technology. Any transcriptional errors that result from this process are unintentional.

## 2019-02-05 NOTE — Plan of Care (Signed)
Patient still having blood in her stools, but less BMs today.  Tolerating her diet.  No complaints of pain.  No significant changes.  Plan for Remicade infusion tomorrow.

## 2019-02-06 DIAGNOSIS — K51911 Ulcerative colitis, unspecified with rectal bleeding: Secondary | ICD-10-CM

## 2019-02-06 LAB — URINALYSIS, ROUTINE W REFLEX MICROSCOPIC
Bilirubin Urine: NEGATIVE
Glucose, UA: NEGATIVE mg/dL
Hgb urine dipstick: NEGATIVE
Ketones, ur: NEGATIVE mg/dL
Leukocytes,Ua: NEGATIVE
Nitrite: NEGATIVE
Protein, ur: NEGATIVE mg/dL
Specific Gravity, Urine: 1.027 (ref 1.005–1.030)
pH: 6 (ref 5.0–8.0)

## 2019-02-06 LAB — GLUCOSE, CAPILLARY
Glucose-Capillary: 79 mg/dL (ref 70–99)
Glucose-Capillary: 65 mg/dL — ABNORMAL LOW (ref 70–99)
Glucose-Capillary: 116 mg/dL — ABNORMAL HIGH (ref 70–99)
Glucose-Capillary: 97 mg/dL (ref 70–99)
Glucose-Capillary: 95 mg/dL (ref 70–99)

## 2019-02-06 LAB — C DIFFICILE QUICK SCREEN W PCR REFLEX
C Diff antigen: NEGATIVE
C Diff interpretation: NOT DETECTED
C Diff toxin: NEGATIVE

## 2019-02-06 LAB — ALBUMIN: Albumin: 3.4 g/dL — ABNORMAL LOW (ref 3.5–5.0)

## 2019-02-06 LAB — CBC
HCT: 33.8 % — ABNORMAL LOW (ref 36.0–46.0)
Hemoglobin: 11 g/dL — ABNORMAL LOW (ref 12.0–15.0)
MCH: 29.3 pg (ref 26.0–34.0)
MCHC: 32.5 g/dL (ref 30.0–36.0)
MCV: 89.9 fL (ref 80.0–100.0)
Platelets: 387 10*3/uL (ref 150–400)
RBC: 3.76 MIL/uL — ABNORMAL LOW (ref 3.87–5.11)
RDW: 12.6 % (ref 11.5–15.5)
WBC: 14.4 10*3/uL — ABNORMAL HIGH (ref 4.0–10.5)
nRBC: 0 % (ref 0.0–0.2)

## 2019-02-06 NOTE — Progress Notes (Signed)
Hypoglycemic Event  CBG: 65  Treatment: 1 juice, 1 personal snack   Symptoms: none  Follow-up CBG: Time:1245 CBG Result:116  Possible Reasons for Event: none known  Comments/MD notified:protocol followed    Haynes Dage

## 2019-02-06 NOTE — Progress Notes (Signed)
[place] at [place] NAME: Tessy Pawelski    MR#:  [id number]  DATE OF BIRTH:  August 18, 1995  SUBJECTIVE:  CHIEF COMPLAINT:   Chief Complaint  Patient presents with  . GI Bleeding   Patient had 1 episode of small bloody stool this morning.  She was given a dose of Remicade recently.  Scheduled for next dose of Remicade today. REVIEW OF SYSTEMS:  Review of Systems  Constitutional: Negative for chills and fever.  HENT: Negative for hearing loss and tinnitus.   Eyes: Negative for blurred vision and double vision.  Respiratory: Negative for cough and hemoptysis.   Cardiovascular: Negative for chest pain and palpitations.  Gastrointestinal: Positive for blood in stool. Negative for abdominal pain, heartburn, nausea and vomiting.  Genitourinary: Negative for dysuria and urgency.  Musculoskeletal: Negative for myalgias and neck pain.  Skin: Negative for itching and rash.  Neurological: Negative for dizziness and headaches.  Psychiatric/Behavioral: Negative for depression and hallucinations.    DRUG ALLERGIES:   Allergies  Allergen Reactions  . Gluten Meal    VITALS:  Blood pressure (!) 107/58, pulse 73, temperature 98.3 F (36.8 C), temperature source Oral, resp. rate 20, height 5' 5"  (1.651 m), weight 59 kg, last menstrual period 01/19/2019, SpO2 100 %. PHYSICAL EXAMINATION:  Physical Exam  GENERAL:  23 y.o.-year-old patient lying in the bed with no acute distress.  EYES: Pupils equal, round, reactive to light and accommodation. No scleral icterus. Extraocular muscles intact.  HEENT: Head atraumatic, normocephalic. Oropharynx and nasopharynx clear.  NECK:  Supple, no jugular venous distention. No thyroid enlargement, no tenderness.  LUNGS: Normal breath sounds bilaterally, no wheezing, rales,rhonchi or crepitation. No use of accessory muscles of respiration.  CARDIOVASCULAR: Regular rate and rhythm, S1, S2 normal. No murmurs, rubs, or  gallops.  ABDOMEN: Soft, nondistended,  mild generalized tenderness but no rebound or guarding.  Bowel sounds present. No organomegaly or mass.  EXTREMITIES: No pedal edema, cyanosis, or clubbing.  NEUROLOGIC: Cranial nerves II through XII are intact. Muscle strength 5/5 in all extremities. Sensation intact. Gait not checked.  PSYCHIATRIC: The patient is alert and oriented x 3.  Normal affect and good eye contact. SKIN: No obvious rash, lesion, or ulcer. LABORATORY PANEL:  Female CBC Recent Labs  Lab 02/06/19 0437  WBC 14.4*  HGB 11.0*  HCT 33.8*  PLT 387   ------------------------------------------------------------------------------------------------------------------ Chemistries  Recent Labs  Lab 02/04/19 0447  NA 137  K 3.7  CL 101  CO2 29  GLUCOSE 90  BUN 18  CREATININE 0.62  CALCIUM 8.5*  MG 2.4   RADIOLOGY:  No results found. ASSESSMENT AND PLAN:    1.   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  Still having bloody stools with abdominal pains despite being on IV steroids for the last several days. - GI panel negative -C. difficile negative - Patient was being assisted as outpatient to receive Humira by Dr. Wynetta Emery, her outpatient gastroenterologist. -Patient seen by gastroenterologist Dr. Vicente Males and had flexible sigmoidoscopy done on 01/29/2019 which revealed severe left-sided colitis. Inflammation was found from the anus to the splenic flexure. This was graded as Mayo Score 3 (severe disease). Biopsied. Await pathology results. R/o HSV/CMV on biopsies. Gluten-free diet for celiac disease. Due to evidence of steroid refractory ulcerative colitis, patient is scheduled for initiation of Remicade by gastroenterologist today.  Patient had a TB PPD skin [facility]    This was read by nursing staff and it was negative with no induration. Patient had also  requested for transfer to [facility] and I discussed case with [facility] 01/31/2019. I was told they had no beds.  Also no indication for transfer since patient can be managed medically with Remicade at this facility.  Patient does not have any indication for urgent surgical intervention at this time.  To follow-up with GI clinic at [facility] as outpatient post discharge from the hospital Patient was given a dose of Remicade on 02/01/2019.  Next dose of Remicade is 02/06/2019.  She has been treated with IV Solu-Medrol which is discontinued today. The patient is improving.  Follow-up Dr. Alice Reichert as outpatient after discharge. Negative C. difficile test.  2.  Anemia - Secondary to #1.  Hemoglobin remains fairly stable  -Continue iron supplementation  3.  Celiac disease - Gluten-free diet  4.  Abdominal pain; improved.  Secondary to ulcerative colitis flareup Analgesic as needed  5.  Hypokalemia.  Improved with potassium supplement.  6.  History of congenital pulmonary valve stenosis Patient had concerns and requested for cardiology consultation due to being started on Remicade recently.  2D echocardiogram done with ejection fraction greater than 65%.  Patient seen by cardiologist.  Pulmonary valve was grossly normal.  No evidence of pulmonary stenosis.   Patient plans to establish care with [facility] cardiology post discharge from the hospital.  [place] cardiology input.  Leukocytosis.  Possible due to steroids. DVT prophylaxis; Lovenox discontinued previously since patient still having intermittent bloody stool.  On SCDs  I discussed with Dr. Marius Ditch. All the records are reviewed and case discussed with Care Management/Social Worker. Management plans discussed with the patient, family and they are in agreement.  CODE STATUS: Full Code  TOTAL TIME TAKING CARE OF THIS PATIENT: 26 minutes.   More than 50% of the time was spent in counseling/coordination of care: YES  POSSIBLE D/C IN 2  DAYS, DEPENDING ON CLINICAL CONDITION.   Demetrios Loll M.D on 02/06/2019 at 3:02 PM  Between 7am to 6pm - Pager - [phone]  After 6pm go to [email or web address] - Proofreader  [facility] [place] [facility]  Office  [phone]  CC: Primary care physician; [facility]  Note: This dictation was prepared with Dragon dictation along with smaller phrase technology. Any transcriptional errors that result from this process are unintentional.

## 2019-02-06 NOTE — Progress Notes (Signed)
Patient IV site bleeding. Dr. Bridgett Larsson gave ordered to discontinue IV.

## 2019-02-06 NOTE — Progress Notes (Signed)
c diff was negative. Dr Marius Ditch gave the order to discontinue enteric isolation

## 2019-02-06 NOTE — Progress Notes (Signed)
Cephas Darby, MD [address]  [place]  [place], [place] [address]  Main: [phone]  Fax: [phone] Pager: [phone]   Subjective: She has been experiencing very loose bowel movements mixed with blood since yesterday associated with mild cramps.  She denies abdominal pain, nausea, vomiting, fever. She also noticed some crystals in her urine although her urine is pale yellow.  She denies any other urinary symptoms  Objective: Vital signs in last 24 hours: Vitals:   02/05/19 0422 02/05/19 1200 02/05/19 1953 02/06/19 0425  BP: (!) 114/59 126/90 107/63 108/68  Pulse: 70 98 76 68  Resp: _0 Temp: 98.9 F (37.2 C) 98.6 F (37 C) 98.5 F (36.9 C) 98.3 F (36.8 C)  TempSrc: Oral Oral Oral Oral  SpO2: 98% 99% 99% 98%  Weight:      Height:       Weight change:   Intake/Output Summary (Last 24 hours) at 02/06/2019 1105 Last data filed at 02/06/2019 0949 Gross per 24 hour  Intake 480 ml  Output 0 ml  Net 480 ml     Exam: Heart:: Regular rate and rhythm or S1S2 present Lungs: normal and clear to auscultation Abdomen: soft, mild tenderness in the upper mid abdomen, normal bowel sounds   Lab Results: CBC Latest Ref Rng & Units 02/06/2019 02/04/2019 02/03/2019  WBC 4.0 - 10.5 K/uL 14.4(H) 12.0(H) 11.4(H)  Hemoglobin 12.0 - 15.0 g/dL 11.0(L) 11.0(L) 10.9(L)  Hematocrit 36.0 - 46.0 % 33.8(L) 33.8(L) 33.1(L)  Platelets 150 - 400 K/uL 387 368 376   CMP Latest Ref Rng & Units 02/04/2019 02/03/2019 02/02/2019  Glucose 70 - 99 mg/dL 90 82 79  BUN 6 - 20 mg/dL _1 Creatinine 0.44 - 1.00 mg/dL 0.62 0.61 0.68  Sodium 135 - 145 mmol/L 137 139 138  Potassium 3.5 - 5.1 mmol/L 3.7 3.5 3.7  Chloride 98 - 111 mmol/L 101 102 103  CO2 22 - 32 mmol/L _2 Calcium 8.9 - 10.3 mg/dL 8.5(L) 8.8(L) 8.8(L)  Total Protein 6.5 - 8.1 g/dL - - -  Total Bilirubin 0.3 - 1.2 mg/dL - - -  Alkaline Phos 38 - 126 U/L - - -  AST 15 - 41 U/L - - -  ALT 0 - 44 U/L - - -    Micro Results: Recent Results (from the past 240 hour(s))  SARS Coronavirus 2 [facility] order, Performed in [facility] hospital lab) Nasopharyngeal Nasopharyngeal Swab     Status: None   Collection Time: 01/27/19  5:06 PM   Specimen: Nasopharyngeal Swab  Result Value Ref Range Status   SARS Coronavirus 2 NEGATIVE NEGATIVE Final    Comment: (NOTE) If result is NEGATIVE SARS-CoV-2 target nucleic acids are NOT DETECTED. The SARS-CoV-2 RNA is generally detectable in upper and lower  respiratory specimens during the acute phase of infection. The lowest  concentration of SARS-CoV-2 viral copies this assay can detect is 250  copies / mL. A negative result does not preclude SARS-CoV-2 infection  and should not be used as the sole basis for treatment or other  patient management decisions.  A negative result may occur with  improper specimen collection / handling, submission of specimen other  than nasopharyngeal swab, presence of viral mutation(s) within the  areas targeted by this assay, and inadequate number of viral copies  (<250 copies / mL). A negative result must be combined with clinical  observations, patient history, and epidemiological information. If  result is POSITIVE SARS-CoV-2 target nucleic acids are DETECTED. The SARS-CoV-2 RNA is generally detectable in upper and lower  respiratory specimens dur ing the acute phase of infection.  Positive  results are indicative of active infection with SARS-CoV-2.  Clinical  correlation with patient history and other diagnostic information is  necessary to determine patient infection status.  Positive results do  not rule out bacterial infection or co-infection with other viruses. If result is PRESUMPTIVE POSTIVE SARS-CoV-2 nucleic acids MAY BE PRESENT.   A presumptive positive result was obtained on the submitted specimen  and confirmed on repeat testing.  While 2019 novel coronavirus  (SARS-CoV-2) nucleic acids may be present in the  submitted sample  additional confirmatory testing may be necessary for epidemiological  and / or clinical management purposes  to differentiate between  SARS-CoV-2 and other Sarbecovirus currently known to infect humans.  If clinically indicated additional testing with an alternate test  methodology [id number]) is advised. The SARS-CoV-2 RNA is generally  detectable in upper and lower respiratory sp ecimens during the acute  phase of infection. The expected result is Negative. Fact Sheet for Patients:  StrictlyIdeas.no Fact Sheet for Healthcare Providers: BankingDealers.co.za This test is not yet approved or cleared by the [place] FDA and has been authorized for detection and/or diagnosis of SARS-CoV-2 by FDA under an Emergency Use Authorization (EUA).  This EUA will remain in effect (meaning this test can be used) for the duration of the COVID-19 declaration under Section 564(b)(1) of the Act, 21 U.S.C. section 360bbb-3(b)(1), unless the authorization is terminated or revoked sooner. Performed at [facility], [address] [place], [place] [address]   C difficile quick scan w PCR reflex     Status: None   Collection Time: 01/27/19  5:06 PM   Specimen: STOOL  Result Value Ref Range Status   C Diff antigen NEGATIVE NEGATIVE Final   C Diff toxin NEGATIVE NEGATIVE Final   C Diff interpretation No C. difficile detected.  Final    Comment: Performed at [facility], [address] [place], [place] [address]  Gastrointestinal Panel by PCR , Stool     Status: None   Collection Time: 01/27/19  5:06 PM   Specimen: Stool  Result Value Ref Range Status   Campylobacter species NOT DETECTED NOT DETECTED Final   Plesimonas shigelloides NOT DETECTED NOT DETECTED Final   Salmonella species NOT DETECTED NOT DETECTED Final   Yersinia enterocolitica NOT DETECTED NOT DETECTED Final   Vibrio species NOT DETECTED NOT DETECTED  Final   Vibrio cholerae NOT DETECTED NOT DETECTED Final   Enteroaggregative E coli (EAEC) NOT DETECTED NOT DETECTED Final   Enteropathogenic E coli (EPEC) NOT DETECTED NOT DETECTED Final   Enterotoxigenic E coli (ETEC) NOT DETECTED NOT DETECTED Final   Shiga like toxin producing E coli (STEC) NOT DETECTED NOT DETECTED Final   Shigella/Enteroinvasive E coli (EIEC) NOT DETECTED NOT DETECTED Final   Cryptosporidium NOT DETECTED NOT DETECTED Final   Cyclospora cayetanensis NOT DETECTED NOT DETECTED Final   Entamoeba histolytica NOT DETECTED NOT DETECTED Final   Giardia lamblia NOT DETECTED NOT DETECTED Final   Adenovirus F40/41 NOT DETECTED NOT DETECTED Final   Astrovirus NOT DETECTED NOT DETECTED Final   Norovirus GI/GII NOT DETECTED NOT DETECTED Final   Rotavirus A NOT DETECTED NOT DETECTED Final   Sapovirus (I, II, IV, and V) NOT DETECTED NOT DETECTED Final    Comment: Performed at [facility], [address],  [place], [place] [address]  Cytomegalovirus (CMV) Culture     Status: None   Collection Time: 01/29/19  2:00 PM   Specimen: Urine  Result Value Ref Range Status   Cytomegalovirus (CMV) Culture Comment  Final    Comment: (NOTE) No Cytomegalovirus detected in the shell vial culture. Conventional tissue culture results to follow. Performed At: [facility] [place], [place] [id number] Rush Farmer MD GB:[id number]    Source of Sample BIOPSY  Final    Comment: COLON Performed at [facility], [address] [place], [place] [address]   Hsv Culture And Typing     Status: None   Collection Time: 01/29/19  2:00 PM  Result Value Ref Range Status   HSV Culture/Type Comment  Final    Comment: (NOTE) Negative No Herpes simplex virus isolated. Performed At: [facility] [place], [place] [id number] Rush Farmer MD OJ:[id number]    Source of Sample ENDO  Final    Comment: Performed at [facility],  [address] [place], [place] [address]   Studies/Results: No results found. Medications:  I have reviewed the patient's current medications. Prior to Admission:  Medications Prior to Admission  Medication Sig Dispense Refill Last Dose  . acetaminophen (TYLENOL) 325 MG tablet Take 2 tablets (650 mg total) by mouth every 6 (six) hours as needed for mild pain (or Fever >/= 101).   prn at prn  . cetirizine (ZYRTEC) 10 MG tablet Take 1 tablet by mouth daily.   Past Week at Unknown time  . predniSONE (DELTASONE) 10 MG tablet Days 1-5: Take 4 tablets (40 mg) daily Days 6-10: Take 3 tablets (30 mg) daily Days 11-15: Take 2 tablets (20 mg) daily Days 16-20: Take 1 tablets (10 mg) daily 50 tablet 0 01/27/2019 at 1100  . Vitamin D, Ergocalciferol, (DRISDOL) 1.25 MG (50000 UT) CAPS capsule Take 50,000 Units by mouth every 7 (seven) days.   Past Month at Unknown time  . Adalimumab (HUMIRA) 40 MG/0.8ML PSKT Inject 0.8 mLs (40 mg total) into the skin once. Repeat injection in 2 weeks. (Patient not taking: Reported on 01/27/2019) 2 each 0 Not Taking at Unknown time  . dicyclomine (BENTYL) 20 MG tablet Take 1 tablet (20 mg total) by mouth 3 (three) times daily as needed for spasms. 90 tablet 0    Scheduled: . feeding supplement (ENSURE ENLIVE)  237 mL Oral BID BM  . hepatitis b vaccine  0.5 mL Intramuscular Once  . insulin aspart  0-9 Units Subcutaneous TID WC  . loratadine  10 mg Oral Daily  . multivitamin with minerals  1 tablet Oral Daily  . pneumococcal 13-valent conjugate vaccine  0.5 mL Intramuscular Tomorrow-1000  . sodium chloride flush  3 mL Intravenous Q12H   Continuous: . sodium chloride Stopped (02/02/19 1637)  . inFLIXimab (REMICADE) infusion     RCV:ELFYBO chloride, acetaminophen **OR** acetaminophen, ondansetron **OR** ondansetron (ZOFRAN) IV, sodium chloride flush Anti-infectives (From admission, onward)   None     Scheduled Meds: . feeding supplement (ENSURE ENLIVE)  237 mL  Oral BID BM  . hepatitis b vaccine  0.5 mL Intramuscular Once  . insulin aspart  0-9 Units Subcutaneous TID WC  . loratadine  10 mg Oral Daily  . multivitamin with minerals  1 tablet Oral Daily  . pneumococcal 13-valent conjugate vaccine  0.5 mL Intramuscular Tomorrow-1000  . sodium chloride flush  3 mL Intravenous Q12H   Continuous Infusions: . sodium chloride Stopped (02/02/19  1637)  . 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  Received first dose at 7.5 mg/kg on 02/01/2019  Plan: Patient will be receiving second dose of Remicade 14m/kg today Discontinued Solu-Medrol Diet as tolerated, lactose-free Check C. difficile and urine analysis Discontinued Lovenox due to rectal bleeding fecal calprotectin level in process TB test is negative, hepatitis A antibody positive, hepatitis B surface antigen, hep B surface antibody negative, E antigen and E antibody, HCV antibody negative, HIV nonreactive.  Patient will need hepatitis B vaccine, Recommend annual influenza vaccine, Pneumovax and Prevnar, HPV vaccines Patient is started on Remicade at 7.539mkg body with intensified protocol due to steroid refractory UC, she received first dose on 02/01/2019 Next dose of Remicade should be administered on day 5, 02/06/19, recommend higher dose at 1074mg as patient demonstrates overall good clinical improvement but has persistent rectal bleeding.  If she has significant persistent rectal bleeding despite second dose of Remicade, [facility]  albumin levels, monitor her symptoms.  Patient is not mounting inflammatory response with CRP and ESR, therefore not reliable Patient is also evaluated by cardiology for pulmonary stenosis which was in the past.  She does not have any evidence of cardiac dysfunction,Valvular abnormality based on echocardiogram today  Iron deficiency anemia secondary to rectal bleeding Received parenteral iron     LOS: 10 days   Anival Pasha 02/06/2019, 11:05 AM

## 2019-02-07 ENCOUNTER — Telehealth: Payer: Self-pay | Admitting: Gastroenterology

## 2019-02-07 LAB — CYTOMEGALOVIRUS (CMV) CULTURE - CMVCUL

## 2019-02-07 LAB — GLUCOSE, CAPILLARY: Glucose-Capillary: 86 mg/dL (ref 70–99)

## 2019-02-07 MED ORDER — OXYCODONE-ACETAMINOPHEN 5-325 MG PO TABS
1.0000 | ORAL_TABLET | Freq: Once | ORAL | Status: AC
  Administered 2019-02-07: 1 via ORAL
  Filled 2019-02-07: qty 1

## 2019-02-07 NOTE — Discharge Summary (Signed)
[facility] NAME: Julie Bentley    MR#:  [id number]  DATE OF BIRTH:  Mar 09, 1996  DATE OF ADMISSION:  01/27/2019   ADMITTING PHYSICIAN: Christel Mormon, MD  DATE OF DISCHARGE: 02/07/2019  PRIMARY CARE PHYSICIAN: [place], [facility]   ADMISSION DIAGNOSIS:  Ulcerative colitis with rectal bleeding, unspecified location ([place]) [K51.911] DISCHARGE DIAGNOSIS:  Active Problems:   Ulcerative colitis, acute ([place])  SECONDARY DIAGNOSIS:   Past Medical History:  Diagnosis Date  . Celiac disease   . 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  Still having bloody stools with abdominal pains despite being on IV steroids for the last several days. -GI panel negative -C. difficile negative -Patient was being assisted as outpatient to receive Humira by Dr. Wynetta Emery, her outpatient gastroenterologist. -Patient seen by gastroenterologist Dr. Vicente Males and had flexible sigmoidoscopy done on 01/29/2019 which revealed severe left-sided colitis. Inflammation was found from the anus to the splenic flexure. This was graded as Mayo Score 3 (severe disease). Biopsied. Await pathology results. R/o HSV/CMV on biopsies. Gluten-free diet for celiac disease. Due to evidence of steroid refractory ulcerative colitis, patient is scheduled for initiation of Remicade by gastroenterologist today.  Patient had a TB PPD skin [facility]   This was read by nursing staff and it was negative with no induration. Patient had also requested for transfer to [facility] and I discussed case with [facility] 01/31/2019. I was told they had no beds.  Also no indication for transfer since patient can be managed medically with Remicade at this facility.  Patient does not have any indication for urgent surgical intervention at this  time.  To follow-up with GI clinic at [facility] as outpatient post discharge from the hospital Patient was given Remicade on 02/01/2019 and on 02/06/2019.  She has been treated with IV Solu-Medrol which is discontinued. The patient is improving.  Follow-up Dr. Alice Reichert as outpatient after discharge. Negative C. difficile test.  2.Anemia -Secondary to #1.  Hemoglobin remains fairly stable  -Continue iron supplementation  3. Celiac disease -Gluten-free diet  4. Abdominal pain; improved.  Secondary to ulcerative colitis flareup Analgesic as needed  5. Hypokalemia.  Improved with potassium supplement.  6.  History of congenital pulmonary valve stenosis Patient had concerns and requested for cardiology consultation due to being started on Remicade recently.  2D echocardiogram done with ejection fraction greater than 65%.  Patient seen by cardiologist.  Pulmonary valve was grossly normal.  No evidence of pulmonary stenosis.   Patient plans to establish care with [facility] cardiology post discharge from the hospital.  [place] cardiology input.  Leukocytosis.  Possible due to steroids. Knee ache. Possible due to side effect of Remicade per Dr. Marius Ditch. DVT prophylaxis; Lovenox discontinued previously since patient still having intermittent bloody stool.  On SCDs  I discussed with Dr. Marius Ditch. DISCHARGE CONDITIONS:  Stable, discharge to home today, CONSULTS OBTAINED:  Treatment Team:  Jonathon Bellows, MD DRUG ALLERGIES:   Allergies  Allergen Reactions  . Gluten Meal    DISCHARGE MEDICATIONS:   Allergies as of 02/07/2019      Reactions   Gluten Meal       Medication List    STOP taking these medications   Adalimumab 40 MG/0.8ML Pskt Commonly known as: Humira   dicyclomine 20 MG  tablet Commonly known as: Bentyl   predniSONE 10 MG tablet Commonly known as: DELTASONE     TAKE these medications   acetaminophen 325 MG tablet Commonly known as: TYLENOL Take 2  tablets (650 mg total) by mouth every 6 (six) hours as needed for mild pain (or Fever >/= 101).   cetirizine 10 MG tablet Commonly known as: ZYRTEC Take 1 tablet by mouth daily.   Vitamin D (Ergocalciferol) 1.25 MG (50000 UT) Caps capsule Commonly known as: DRISDOL Take 50,000 Units by mouth every 7 (seven) days.        DISCHARGE INSTRUCTIONS:  See AVS If you experience worsening of your admission symptoms, develop shortness of breath, life threatening emergency, suicidal or homicidal thoughts you must seek medical attention immediately by calling 911 or calling your MD immediately  if symptoms less severe.  You Must read complete instructions/literature along with all the possible adverse reactions/side effects for all the Medicines you take and that have been prescribed to you. Take any new Medicines after you have completely understood and accpet all the possible adverse reactions/side effects.   Please note  You were cared for by a hospitalist during your hospital stay. If you have any questions about your discharge medications or the care you received while you were in the hospital after you are discharged, you can call the unit and asked to speak with the hospitalist on call if the hospitalist that took care of you is not available. Once you are discharged, your primary care physician will handle any further medical issues. Please note that NO REFILLS for any discharge medications will be authorized once you are discharged, as it is imperative that you return to your primary care physician (or establish a relationship with a primary care physician if you do not have one) for your aftercare needs so that they can reassess your need for medications and monitor your lab values.    On the day of Discharge:  VITAL SIGNS:  Blood pressure 106/72, pulse 72, temperature 98.7 F (37.1 C), temperature source Oral, resp. rate 20, height 5' 5"  (1.651 m), weight 59 kg, last menstrual period  01/19/2019, SpO2 99 %. PHYSICAL EXAMINATION:  GENERAL:  23 y.o.-year-old patient lying in the bed with no acute distress.  EYES: Pupils equal, round, reactive to light and accommodation. No scleral icterus. Extraocular muscles intact.  HEENT: Head atraumatic, normocephalic. Oropharynx and nasopharynx clear.  NECK:  Supple, no jugular venous distention. No thyroid enlargement, no tenderness.  LUNGS: Normal breath sounds bilaterally, no wheezing, rales,rhonchi or crepitation. No use of accessory muscles of respiration.  CARDIOVASCULAR: S1, S2 normal. No murmurs, rubs, or gallops.  ABDOMEN: Soft, non-tender, non-distended. Bowel sounds present. No organomegaly or mass.  EXTREMITIES: No pedal edema, cyanosis, or clubbing.  NEUROLOGIC: Cranial nerves II through XII are intact. Muscle strength 5/5 in all extremities. Sensation intact. Gait not checked.  PSYCHIATRIC: The patient is alert and oriented x 3.  SKIN: No obvious rash, lesion, or ulcer.  DATA REVIEW:   CBC Recent Labs  Lab 02/06/19 0437  WBC 14.4*  HGB 11.0*  HCT 33.8*  PLT 387    Chemistries  Recent Labs  Lab 02/04/19 0447  NA 137  K 3.7  CL 101  CO2 29  GLUCOSE 90  BUN 18  CREATININE 0.62  CALCIUM 8.5*  MG 2.4     Microbiology Results  Results for orders placed or performed during the hospital encounter of 01/27/19  SARS Coronavirus 2 [facility] order, Performed  in [place] lab) Nasopharyngeal Nasopharyngeal Swab     Status: None   Collection Time: 01/27/19  5:06 PM   Specimen: Nasopharyngeal Swab  Result Value Ref Range Status   SARS Coronavirus 2 NEGATIVE NEGATIVE Final    Comment: (NOTE) If result is NEGATIVE SARS-CoV-2 target nucleic acids are NOT DETECTED. The SARS-CoV-2 RNA is generally detectable in upper and lower  respiratory specimens during the acute phase of infection. The lowest  concentration of SARS-CoV-2 viral copies this assay can detect is 250  copies / mL. A negative result does  not preclude SARS-CoV-2 infection  and should not be used as the sole basis for treatment or other  patient management decisions.  A negative result may occur with  improper specimen collection / handling, submission of specimen other  than nasopharyngeal swab, presence of viral mutation(s) within the  areas targeted by this assay, and inadequate number of viral copies  (<250 copies / mL). A negative result must be combined with clinical  observations, patient history, and epidemiological information. If result is POSITIVE SARS-CoV-2 target nucleic acids are DETECTED. The SARS-CoV-2 RNA is generally detectable in upper and lower  respiratory specimens dur ing the acute phase of infection.  Positive  results are indicative of active infection with SARS-CoV-2.  Clinical  correlation with patient history and other diagnostic information is  necessary to determine patient infection status.  Positive results do  not rule out bacterial infection or co-infection with other viruses. If result is PRESUMPTIVE POSTIVE SARS-CoV-2 nucleic acids MAY BE PRESENT.   A presumptive positive result was obtained on the submitted specimen  and confirmed on repeat testing.  While 2019 novel coronavirus  (SARS-CoV-2) nucleic acids may be present in the submitted sample  additional confirmatory testing may be necessary for epidemiological  and / or clinical management purposes  to differentiate between  SARS-CoV-2 and other Sarbecovirus currently known to infect humans.  If clinically indicated additional testing with an alternate test  methodology [phone]) is advised. The SARS-CoV-2 RNA is generally  detectable in upper and lower respiratory sp ecimens during the acute  phase of infection. The expected result is Negative. Fact Sheet for Patients:  StrictlyIdeas.no Fact Sheet for Healthcare Providers: BankingDealers.co.za This test is not yet approved or  cleared by the [place] FDA and has been authorized for detection and/or diagnosis of SARS-CoV-2 by FDA under an Emergency Use Authorization (EUA).  This EUA will remain in effect (meaning this test can be used) for the duration of the COVID-19 declaration under Section 564(b)(1) of the Act, 21 U.S.C. section 360bbb-3(b)(1), unless the authorization is terminated or revoked sooner. Performed at [facility], [address] [place], [place] [address]   C difficile quick scan w PCR reflex     Status: None   Collection Time: 01/27/19  5:06 PM   Specimen: STOOL  Result Value Ref Range Status   C Diff antigen NEGATIVE NEGATIVE Final   C Diff toxin NEGATIVE NEGATIVE Final   C Diff interpretation No C. difficile detected.  Final    Comment: Performed at [facility], [address] [place], [place] [address]  Gastrointestinal Panel by PCR , Stool     Status: None   Collection Time: 01/27/19  5:06 PM   Specimen: Stool  Result Value Ref Range Status   Campylobacter species NOT DETECTED NOT DETECTED Final   Plesimonas shigelloides NOT DETECTED NOT DETECTED Final   Salmonella species NOT DETECTED NOT DETECTED Final  Yersinia enterocolitica NOT DETECTED NOT DETECTED Final   Vibrio species NOT DETECTED NOT DETECTED Final   Vibrio cholerae NOT DETECTED NOT DETECTED Final   Enteroaggregative E coli (EAEC) NOT DETECTED NOT DETECTED Final   Enteropathogenic E coli (EPEC) NOT DETECTED NOT DETECTED Final   Enterotoxigenic E coli (ETEC) NOT DETECTED NOT DETECTED Final   Shiga like toxin producing E coli (STEC) NOT DETECTED NOT DETECTED Final   Shigella/Enteroinvasive E coli (EIEC) NOT DETECTED NOT DETECTED Final   Cryptosporidium NOT DETECTED NOT DETECTED Final   Cyclospora cayetanensis NOT DETECTED NOT DETECTED Final   Entamoeba histolytica NOT DETECTED NOT DETECTED Final   Giardia lamblia NOT DETECTED NOT DETECTED Final   Adenovirus F40/41 NOT DETECTED NOT DETECTED  Final   Astrovirus NOT DETECTED NOT DETECTED Final   Norovirus GI/GII NOT DETECTED NOT DETECTED Final   Rotavirus A NOT DETECTED NOT DETECTED Final   Sapovirus (I, II, IV, and V) NOT DETECTED NOT DETECTED Final    Comment: Performed at [facility], [address] [place], [place] [address]  Cytomegalovirus (CMV) Culture     Status: None   Collection Time: 01/29/19  2:00 PM   Specimen: Urine  Result Value Ref Range Status   Cytomegalovirus (CMV) Culture Comment  Final    Comment: (NOTE) No Cytomegalovirus isolated. Performed At: [facility] [place], [place] [id number] Rush Farmer MD QV:[id number]    Source of Sample BIOPSY  Final    Comment: COLON Performed at [facility], [address] [place], [place] [address]   Hsv Culture And Typing     Status: None   Collection Time: 01/29/19  2:00 PM  Result Value Ref Range Status   HSV Culture/Type Comment  Final    Comment: (NOTE) Negative No Herpes simplex virus isolated. Performed At: [facility] [place], [place] [id number] Rush Farmer MD KZ:[id number]    Source of Sample ENDO  Final    Comment: Performed at [facility], [address] [place], [place] [address]  C difficile quick scan w PCR reflex     Status: None   Collection Time: 02/06/19 11:39 AM   Specimen: STOOL  Result Value Ref Range Status   C Diff antigen NEGATIVE NEGATIVE Final   C Diff toxin NEGATIVE NEGATIVE Final   C Diff interpretation No C. difficile detected.  Final    Comment: Performed at [facility], [address] [place], [place] [address]    RADIOLOGY:  No results found.   Management plans discussed with the patient, family and they are in agreement.  CODE STATUS: Full Code   TOTAL TIME TAKING CARE OF THIS PATIENT: 32 minutes.    Demetrios Loll M.D on 02/07/2019 at 11:36 AM  Between 7am to 6pm - Pager - [phone]  After 6pm go to [email or web address]  - Proofreader  [facility]  Office  [phone]  CC: Primary care physician; [facility]   Note: This dictation was prepared with Dragon dictation along with smaller phrase technology. Any transcriptional errors that result from this process are unintentional.

## 2019-02-07 NOTE — Progress Notes (Signed)
MD ordered patient to be discharged home.  Discharge instructions were reviewed with the patient and she voiced understanding.  Follow-up appointment was made.  No prescriptions given to the patient. All patients questions were answered.  Patient left via wheelchair escorted by NT.

## 2019-02-07 NOTE — Discharge Instructions (Signed)
Follow up [facility] cardiologist

## 2019-02-07 NOTE — Telephone Encounter (Signed)
Patient started experiencing body aches, muscle aches about 1 to 2 hours after Remicade infusion yesterday.  She reports feeling better today.  Rectal bleeding has significantly reduced.  Currently having 2 soft bowel movements daily, mixed with blood  Most likely myalgias and arthralgias are secondary to high-dose Remicade 10mg /kg Discussed with patient to have next Remicade dose at 7.5mg /kg on 02/20/2019 Patient has appointment with Dr. Alice Reichert on 02/16/2019  Patient expressed understanding of the plan  Cephas Darby, MD [address]  [place]  [place], [place] [address]  Main: [phone]  Fax: [phone] Pager: [phone]

## 2019-02-07 NOTE — Progress Notes (Signed)
Patient complained of pain in knees bilaterally radiating up her thighs. Acetaminophen given per patient request.  On call hospitalist notified of complaint of pain.  Continue to asses.

## 2019-02-08 LAB — CALPROTECTIN, FECAL: Calprotectin, Fecal: 4879 ug/g — ABNORMAL HIGH (ref 0–120)

## 2019-12-23 ENCOUNTER — Ambulatory Visit: Payer: Medicaid Other | Attending: Internal Medicine

## 2019-12-23 ENCOUNTER — Other Ambulatory Visit: Payer: Self-pay

## 2019-12-23 DIAGNOSIS — Z23 Encounter for immunization: Secondary | ICD-10-CM

## 2019-12-23 NOTE — Progress Notes (Signed)
   Covid-19 Vaccination Clinic  Name:  Julie Bentley    MRN: [id number] DOB: 10-31-1995  12/23/2019  [name] was observed post Covid-19 immunization for 15 minutes without incident. She was provided with Vaccine Information Sheet and instruction to access the V-Safe system.   [name] was instructed to call 911 with any severe reactions post vaccine: Marland Kitchen Difficulty breathing  . Swelling of face and throat  . A fast heartbeat  . A bad rash all over body  . Dizziness and weakness   Immunizations Administered    Name Date Dose VIS Date Route   Pfizer COVID-19 Vaccine 12/23/2019 10:21 AM 0.3 mL 08/23/2018 Intramuscular   Manufacturer: [place]   Lot: [id number]   [place]: [id number]

## 2020-01-13 ENCOUNTER — Ambulatory Visit: Payer: Medicaid Other | Attending: Internal Medicine

## 2020-01-13 DIAGNOSIS — Z23 Encounter for immunization: Secondary | ICD-10-CM

## 2020-01-13 NOTE — Progress Notes (Signed)
   Covid-19 Vaccination Clinic  Name:  Julie Bentley    MRN: [id number] DOB: Jul 14, 1995  01/13/2020  Julie Bentley was observed post Covid-19 immunization for 15 minutes without incident. She was provided with Vaccine Information Sheet and instruction to access the V-Safe system.   Julie Bentley was instructed to call 911 with any severe reactions post vaccine: Marland Kitchen Difficulty breathing  . Swelling of face and throat  . A fast heartbeat  . A bad rash all over body  . Dizziness and weakness   Immunizations Administered    Name Date Dose VIS Date Route   Pfizer COVID-19 Vaccine 01/13/2020 10:16 AM 0.3 mL 08/23/2018 Intramuscular   Manufacturer: [place]   Lot: [id number]   [place]: [id number]

## 2020-02-23 ENCOUNTER — Other Ambulatory Visit: Payer: Self-pay

## 2020-02-23 ENCOUNTER — Emergency Department
Admission: EM | Admit: 2020-02-23 | Discharge: 2020-02-23 | Disposition: A | Discharge status: Home / Self Care | Payer: BC Managed Care – PPO | Attending: Emergency Medicine | Admitting: Emergency Medicine

## 2020-02-23 ENCOUNTER — Encounter: Payer: Self-pay | Admitting: Emergency Medicine

## 2020-02-23 ENCOUNTER — Emergency Department: Payer: BC Managed Care – PPO

## 2020-02-23 DIAGNOSIS — M722 Plantar fascial fibromatosis: Secondary | ICD-10-CM | POA: Diagnosis not present

## 2020-02-23 DIAGNOSIS — M79671 Pain in right foot: Secondary | ICD-10-CM

## 2020-02-23 DIAGNOSIS — M25571 Pain in right ankle and joints of right foot: Secondary | ICD-10-CM | POA: Insufficient documentation

## 2020-02-23 DIAGNOSIS — Z79899 Other long term (current) drug therapy: Secondary | ICD-10-CM | POA: Insufficient documentation

## 2020-02-23 HISTORY — DX: Ileostomy status: Z93.2

## 2020-02-23 MED ORDER — ETODOLAC 400 MG PO TABS: 400.0000 mg | ORAL_TABLET | Freq: Two times a day (BID) | ORAL | 2 refills | Status: AC

## 2020-02-23 NOTE — ED Provider Notes (Signed)
[facility] Emergency Department Provider Note   ____________________________________________   First MD Initiated Contact with Patient 02/23/20 1056     (approximate)  I have reviewed the triage vital signs and the nursing notes.   HISTORY  Chief Complaint Ankle Pain   HPI Julie Bentley is a 24 y.o. female presents to the ED with complaint of right ankle pain that began 2 weeks ago.  Patient denies any injury.  She states that her pain is mostly from the arch of her foot into her heel.  There is been some minimal swelling and patient has tried elevation without any improvement.  She has not taken any over-the-counter medication for the pain.     Past Medical History:  Diagnosis Date  . Celiac disease   . Ileostomy in place [facility])   . UC (ulcerative colitis) [facility])     Patient Active Problem List   Diagnosis Date Noted  . Ulcerative colitis, acute ([place]) 01/27/2019  . Malnutrition of moderate degree 07/29/2015  . Anemia 07/26/2015  . UC (ulcerative colitis) ([place]) 07/26/2015  . Celiac disease 07/26/2015    Past Surgical History:  Procedure Laterality Date  . COLECTOMY    . FLEXIBLE SIGMOIDOSCOPY N/A 01/29/2019   Procedure: FLEXIBLE SIGMOIDOSCOPY;  Surgeon: Jonathon Bellows, MD;  Location: [facility] ENDOSCOPY;  Service: Gastroenterology;  Laterality: N/A;    Prior to Admission medications   Medication Sig Start Date End Date Taking? Authorizing Provider  acetaminophen (TYLENOL) 325 MG tablet Take 2 tablets (650 mg total) by mouth every 6 (six) hours as needed for mild pain (or Fever >/= 101). 07/30/15   Nicholes Mango, MD  cetirizine (ZYRTEC) 10 MG tablet Take 1 tablet by mouth daily. 04/29/15   [provider]  etodolac (LODINE) 400 MG tablet Take 1 tablet (400 mg total) by mouth 2 (two) times daily. 02/23/20 02/22/21  Johnn Hai, PA-C  Vitamin D, Ergocalciferol, (DRISDOL) 1.25 MG (50000 UT) CAPS capsule Take 50,000 Units by mouth every 7 (seven)  days.    [provider]    Allergies Gluten meal  No family history on file.  Social History Social History   Tobacco Use  . Smoking status: Never Smoker  . Smokeless tobacco: Never Used  Substance Use Topics  . Alcohol use: No  . Drug use: Not on file    Review of Systems Constitutional: No fever/chills Cardiovascular: Denies chest pain. Respiratory: Denies shortness of breath. Gastrointestinal: No abdominal pain.  No nausea, no vomiting.    Genitourinary: Negative for dysuria. Musculoskeletal: Positive for right foot/ankle pain. Skin: Negative for rash. Neurological: Negative for headaches, focal weakness or numbness. ____________________________________________   PHYSICAL EXAM:  VITAL SIGNS: ED Triage Vitals  Enc Vitals Group     BP 02/23/20 1024 106/65     Pulse Rate 02/23/20 1024 70     Resp 02/23/20 1024 16     Temp 02/23/20 1024 98.5 F (36.9 C)     Temp Source 02/23/20 1024 Oral     SpO2 02/23/20 1024 100 %     Weight 02/23/20 1025 125 lb (56.7 kg)     Height 02/23/20 1025 5' 5"  (1.651 m)     Head Circumference --      Peak Flow --      Pain Score 02/23/20 1006 0     Pain Loc --      Pain Edu? --      Excl. in [place]? --     Constitutional: Alert  and oriented. Well appearing and in no acute distress. Eyes: Conjunctivae are normal.  Head: Atraumatic. Neck: No stridor.   Cardiovascular: Normal rate, regular rhythm. Grossly normal heart sounds.  Good peripheral circulation. Respiratory: Normal respiratory effort.  No retractions. Lungs CTAB. Musculoskeletal: On examination of the right foot there is no gross deformity noted.  There is tenderness on palpation of the plantar aspect of the right foot starting approximately mid arch.  No increase in pain with palpation of the calcaneus or Achilles tendon.  Patient is able to flex and extend without any restriction.  Skin is intact.  No discoloration noted.  Motor sensory function intact to the  digits. Neurologic:  Normal speech and language. No gross focal neurologic deficits are appreciated. No gait instability. Skin:  Skin is warm, dry and intact. No rash noted. Psychiatric: Mood and affect are normal. Speech and behavior are normal.  ____________________________________________   LABS (all labs ordered are listed, but only abnormal results are displayed)  Labs Reviewed - No data to display ____________________________________________ ___________________________________________  RADIOLOGY   Official radiology report(s): DG Foot Complete Right  Result Date: 02/23/2020 CLINICAL DATA:  Right foot pain for 2 weeks. EXAM: RIGHT FOOT COMPLETE - 3+ VIEW COMPARISON:  None. FINDINGS: Hallux valgus noted. Minimal notching in the tuft of the distal phalanx of the second toe is thought to be incidental. Normal Lisfranc joint alignment. No additional significant bony findings. No tibiotalar joint effusion observed. IMPRESSION: 1. Hallux valgus. 2. If foot pain persists despite conservative therapy, MRI follow up may provide useful complementary information. Electronically Signed   By: Van Clines M.D.   On: 02/23/2020 11:46    ____________________________________________   PROCEDURES  Procedure(s) performed (including Critical Care):  Procedures   ____________________________________________   INITIAL IMPRESSION / ASSESSMENT AND PLAN / ED COURSE  As part of my medical decision making, I reviewed the following data within the electronic MEDICAL RECORD NUMBER Notes from prior ED visits and [place] was evaluated in Emergency Department on 02/23/2020 for the symptoms described in the history of present illness. She was evaluated in the context of the global COVID-19 pandemic, which necessitated consideration that the patient might be at risk for infection with the SARS-CoV-2 virus that causes COVID-19. Institutional protocols and algorithms  that pertain to the evaluation of patients at risk for COVID-19 are in a state of rapid change based on information released by regulatory bodies including the CDC and federal and state organizations. These policies and algorithms were followed during the patient's care in the ED.  24 year old female presents to the ED with complaint of right ankle/foot pain for approximately 2 weeks.  Patient is unaware of any known injury.  She has not taken any over-the-counter medication for her pain.  On exam there is some tenderness on palpation of the plantar aspect of her foot but no gross deformity or discoloration is noted.  X-ray is negative for any bony injury.  Patient was encouraged to wear shoes with arch support and a prescription for etodolac 400 mg was sent to her pharmacy to take twice daily.  If not improving she is to follow-up with Dr. Vickki Muff who is on-call for podiatry.  We also discussed using a frozen water bottle to use to her foot at night to see if this helps with some of her pain.  ____________________________________________   FINAL CLINICAL IMPRESSION(S) / ED DIAGNOSES  Final diagnoses:  Acute foot pain, right  Plantar fasciitis of  right foot     ED Discharge Orders         Ordered    etodolac (LODINE) 400 MG tablet  2 times daily        02/23/20 1219           Note:  This document was prepared using Dragon voice recognition software and may include unintentional dictation errors.    Johnn Hai, PA-C 02/23/20 1312    Nena Polio, MD 02/23/20 [phone]

## 2020-02-23 NOTE — Discharge Instructions (Signed)
Follow-up with Dr. Samara Deist if any continued problems or not improving.  His contact information is listed on your discharge papers.  Wear a supportive shoe with lots of support especially in the arch of the foot.  Use the ice water bottle as we discussed.  A prescription for etodolac was sent to your pharmacy.  This is twice a day with food which should help with inflammation and pain.

## 2020-02-23 NOTE — ED Notes (Signed)
See triage note  Presents with pain to right ankle  States pain started about 2 weeks ago  No known injury   States pain is mainly from arch into heel  Min swelling noted

## 2020-02-23 NOTE — ED Triage Notes (Signed)
Pain and swelling right ankle for 2 weeks.  Does not recall any injry. Says tentder to palpation.

## 2021-03-16 IMAGING — DX DG FOOT COMPLETE 3+V*R*
3 series · 3 of 3 positions shown · non-contrast
Comparison: None.

CLINICAL DATA: Right foot pain for 2 weeks.

EXAM:
RIGHT FOOT COMPLETE - 3+ VIEW

[foot ap]
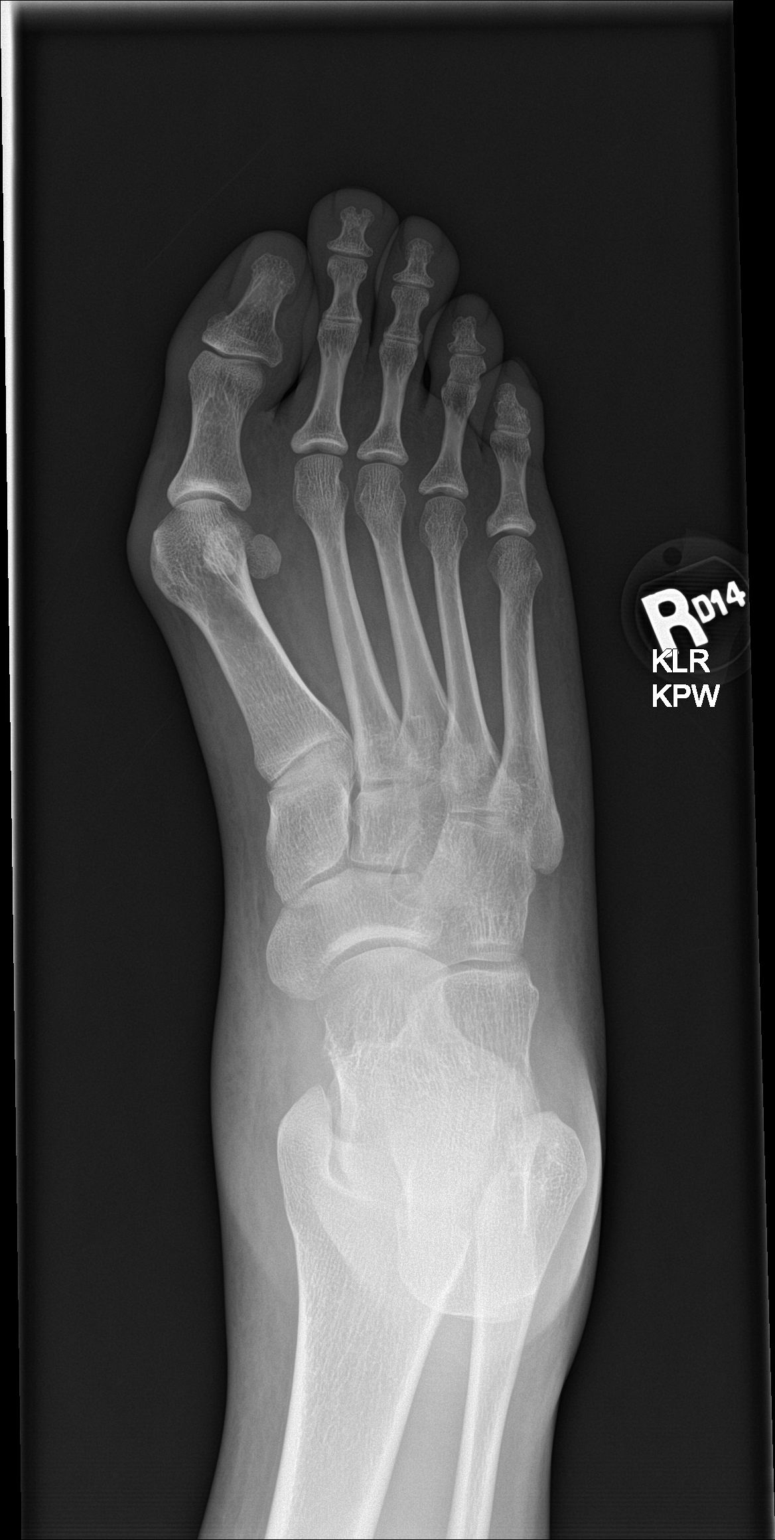

[foot obl]
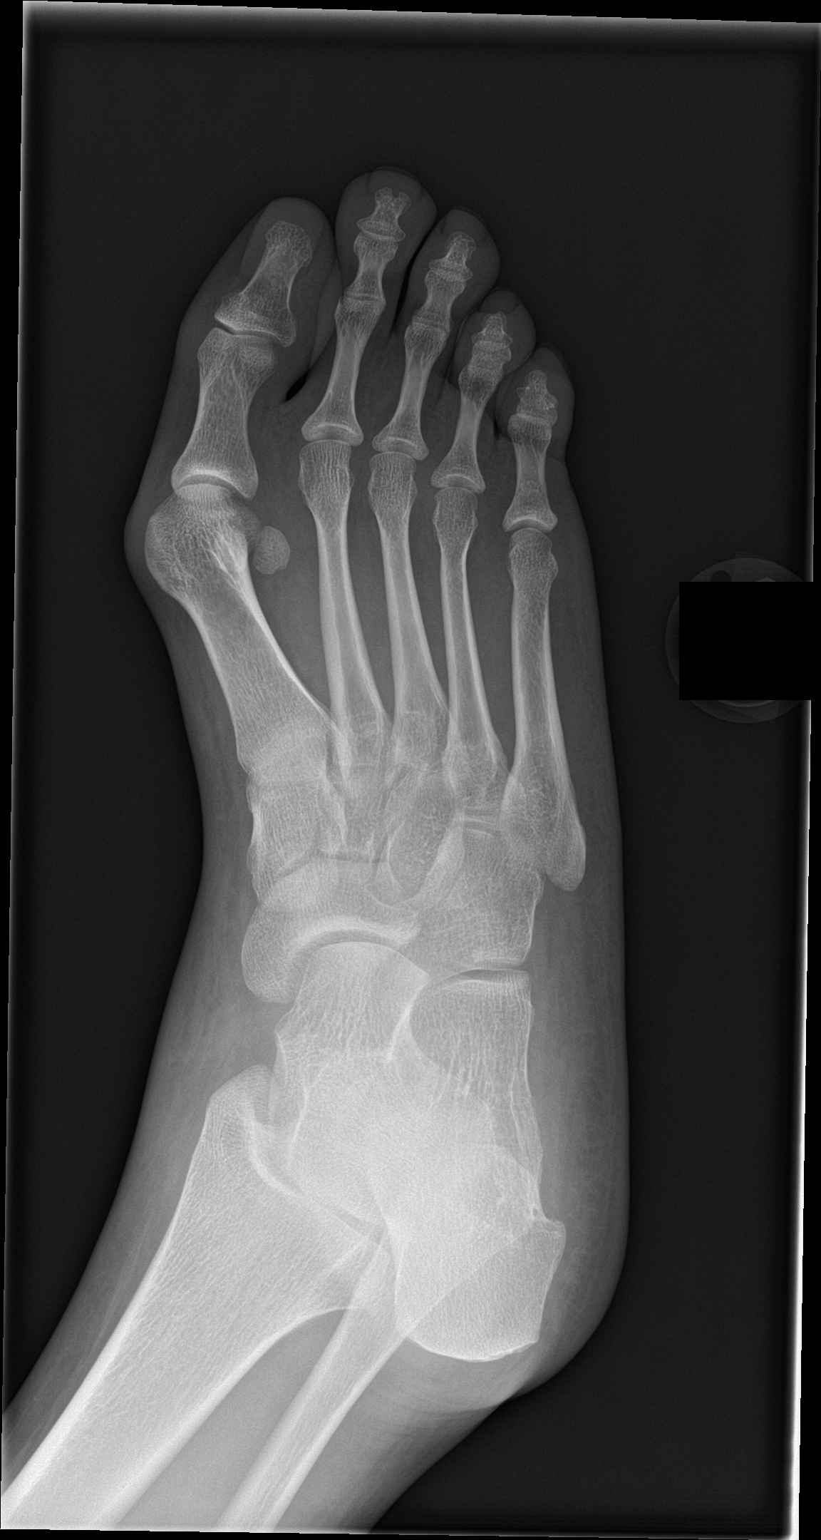

[foot lat]
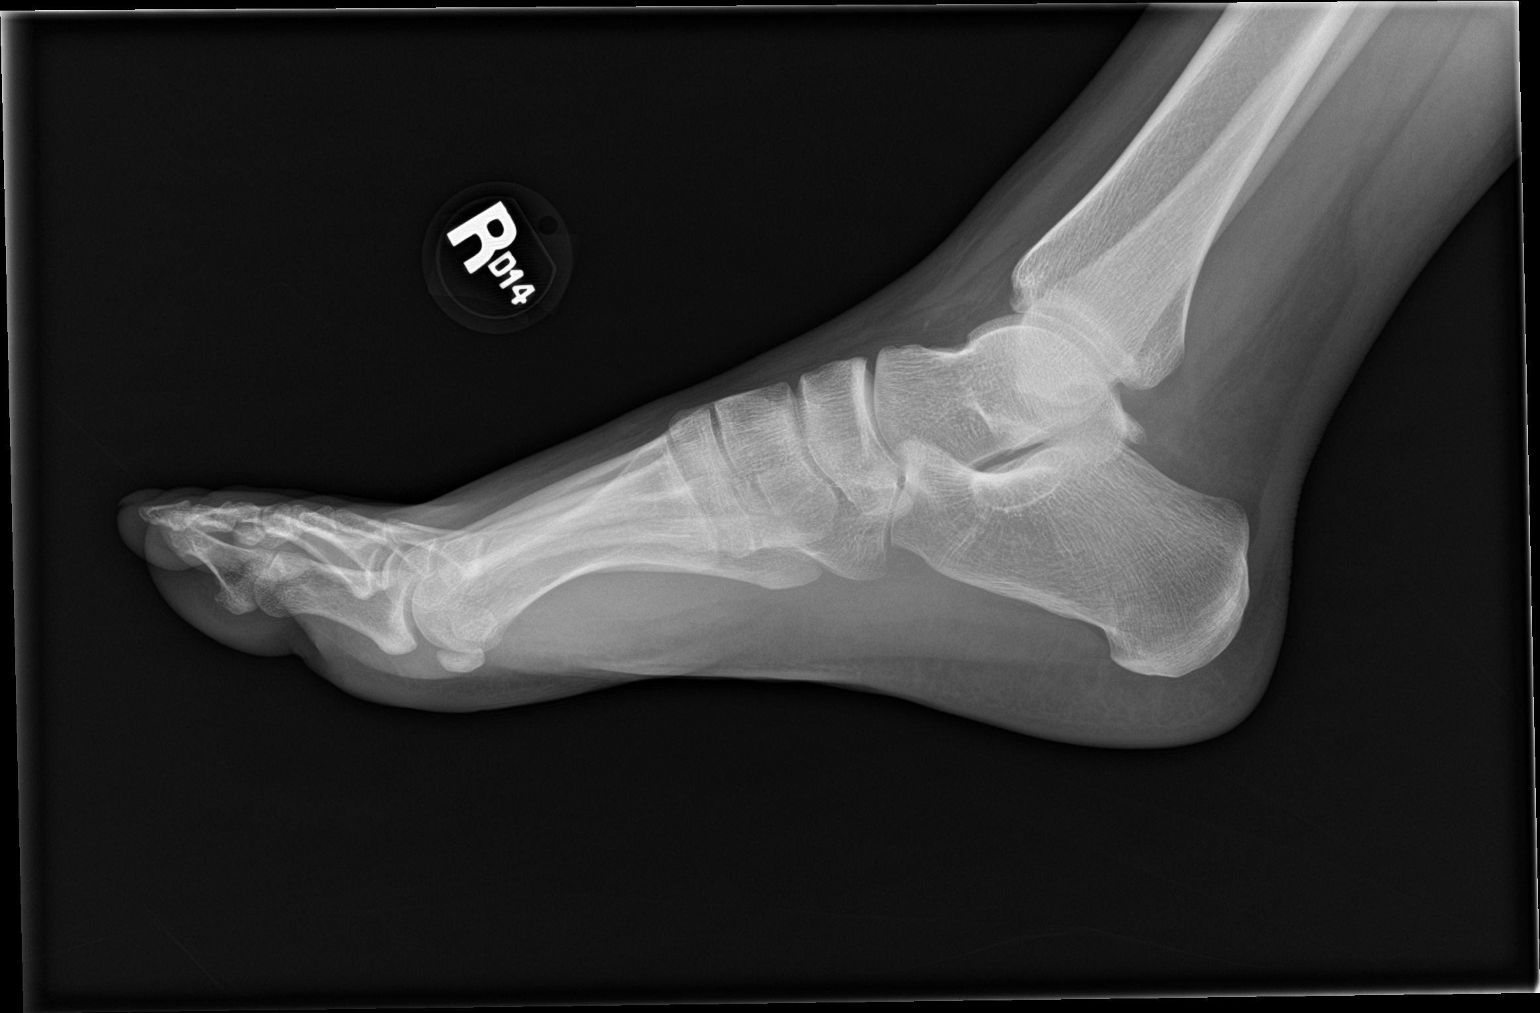

[3 of 3 positions shown; findings below may reference images not displayed]

FINDINGS: Hallux valgus noted. Minimal notching in the tuft of the distal
phalanx of the second toe is thought to be incidental. Normal
Lisfranc joint alignment. No additional significant bony findings.
No tibiotalar joint effusion observed.
IMPRESSION: 1. Hallux valgus.
2. If foot pain persists despite conservative therapy, MRI follow up
may provide useful complementary information.

## 2021-04-13 ENCOUNTER — Ambulatory Visit: Payer: BC Managed Care – PPO

## 2021-04-13 ENCOUNTER — Other Ambulatory Visit: Payer: Self-pay

## 2021-04-13 ENCOUNTER — Ambulatory Visit
Admission: EM | Admit: 2021-04-13 | Discharge: 2021-04-13 | Disposition: A | Discharge status: Home / Self Care | Payer: BC Managed Care – PPO | Attending: Internal Medicine | Admitting: Internal Medicine

## 2021-04-13 DIAGNOSIS — Z1152 Encounter for screening for COVID-19: Secondary | ICD-10-CM

## 2021-04-13 DIAGNOSIS — R519 Headache, unspecified: Secondary | ICD-10-CM

## 2021-04-13 DIAGNOSIS — Z20822 Contact with and (suspected) exposure to covid-19: Secondary | ICD-10-CM | POA: Diagnosis not present

## 2021-04-13 MED ORDER — PSEUDOEPHEDRINE HCL ER 120 MG PO TB12: 120.0000 mg | ORAL_TABLET | Freq: Two times a day (BID) | ORAL | 0 refills | Status: DC

## 2021-04-13 MED ORDER — MECLIZINE HCL 25 MG PO TABS: 25.0000 mg | ORAL_TABLET | Freq: Three times a day (TID) | ORAL | 0 refills | Status: DC | PRN

## 2021-04-13 MED ORDER — BENZONATATE 200 MG PO CAPS: 200.0000 mg | ORAL_CAPSULE | Freq: Two times a day (BID) | ORAL | 0 refills | Status: DC | PRN

## 2021-04-13 NOTE — ED Triage Notes (Signed)
Pt said since Wednesday she has been having cough, congestion, fevers, fatigue, dizziness. Pt has been taking motrin for fevers and body aches.

## 2021-04-13 NOTE — ED Provider Notes (Addendum)
Roderic Palau    CSN: [id number] Arrival date & time: 04/13/21  0177      History   Chief Complaint Chief Complaint  Patient presents with   Cough   Nasal Congestion    HPI Julie Bentley is a 25 y.o. female who presents with onset  scratchy throat and fever up to 103 5 days ago which lasted til yesterday am. The ST has resolved. Has non-productive cough since it started and on occasion has white stringy mucous. Has never had covid infection. Has had 3 covid shots.  She took 2 advils this am and her HA is gone  LMP end of September  Past Medical History:  Diagnosis Date   Celiac disease    Ileostomy in place [facility])    UC (ulcerative colitis) [facility])     Patient Active Problem List   Diagnosis Date Noted   Ulcerative colitis, acute ([place]) 01/27/2019   Malnutrition of moderate degree 07/29/2015   Anemia 07/26/2015   UC (ulcerative colitis) ([place]) 07/26/2015   Celiac disease 07/26/2015    Past Surgical History:  Procedure Laterality Date   COLECTOMY     FLEXIBLE SIGMOIDOSCOPY N/A 01/29/2019   Procedure: FLEXIBLE SIGMOIDOSCOPY;  Surgeon: Jonathon Bellows, MD;  Location: [facility] ENDOSCOPY;  Service: Gastroenterology;  Laterality: N/A;    OB History   No obstetric history on file.      Home Medications    Prior to Admission medications   Medication Sig Start Date End Date Taking? Authorizing Provider  benzonatate (TESSALON) 200 MG capsule Take 1 capsule (200 mg total) by mouth 2 (two) times daily as needed for cough. 04/13/21  Yes Rodriguez-Southworth, Sunday Spillers, PA-C  meclizine (ANTIVERT) 25 MG tablet Take 1 tablet (25 mg total) by mouth 3 (three) times daily as needed for dizziness. For dizziness 04/13/21  Yes Rodriguez-Southworth, Sandrea Matte  pseudoephedrine (SUDAFED 12 HOUR) 120 MG 12 hr tablet Take 1 tablet (120 mg total) by mouth 2 (two) times daily. 04/13/21  Yes Rodriguez-Southworth, Sunday Spillers, PA-C  acetaminophen (TYLENOL) 325 MG tablet Take 2 tablets (650 mg total)  by mouth every 6 (six) hours as needed for mild pain (or Fever >/= 101). 07/30/15   Nicholes Mango, MD  cetirizine (ZYRTEC) 10 MG tablet Take 1 tablet by mouth daily. 04/29/15   [provider]  Vitamin D, Ergocalciferol, (DRISDOL) 1.25 MG (50000 UT) CAPS capsule Take 50,000 Units by mouth every 7 (seven) days.    [provider]    Family History No family history on file.  Social History Social History   Tobacco Use   Smoking status: Never   Smokeless tobacco: Never  Substance Use Topics   Alcohol use: No     Allergies   Gluten meal   Review of Systems Review of Systems  Constitutional:  Positive for diaphoresis and fatigue. Negative for appetite change, chills and fever.  HENT:  Positive for congestion, rhinorrhea, sinus pressure and sinus pain. Negative for ear discharge, ear pain, postnasal drip and sore throat.   Eyes:  Negative for discharge.  Respiratory:  Positive for cough. Negative for chest tightness, shortness of breath and wheezing.        Only gets palpitation when laying down and gets little SOB which is new since illness.   Cardiovascular:  Positive for palpitations. Negative for chest pain.  Gastrointestinal:  Positive for nausea. Negative for diarrhea and vomiting.  Genitourinary:  Positive for frequency and menstrual problem. Negative for dysuria and urgency.  Her bleeding from menses is heavier right now and has hx of anemia  Musculoskeletal:  Positive for myalgias. Negative for gait problem.  Skin:  Negative for rash.  Neurological:  Positive for dizziness and headaches.       Dizziness described as spinning and feels it when sitting   Hematological:  Negative for adenopathy.    Physical Exam Triage Vital Signs ED Triage Vitals  Enc Vitals Group     BP 04/13/21 0823 108/71     Pulse Rate 04/13/21 0823 91     Resp 04/13/21 0823 16     Temp 04/13/21 0823 98.9 F (37.2 C)     Temp Source 04/13/21 0823 Oral     SpO2 04/13/21  0823 96 %     Weight --      Height --      Head Circumference --      Peak Flow --      Pain Score 04/13/21 0832 5     Pain Loc --      Pain Edu? --      Excl. in [place]? --    No data found.  Updated Vital Signs BP 108/71 (BP Location: Left Arm)   Pulse 91   Temp 98.9 F (37.2 C) (Oral)   Resp 16   LMP 03/24/2021   SpO2 96%   Visual Acuity Right Eye Distance:   Left Eye Distance:   Bilateral Distance:    Right Eye Near:   Left Eye Near:    Bilateral Near:     Physical Exam Repeated pulse ox 97% Physical Exam Vitals signs and nursing note reviewed.  Constitutional:      General: She is not in acute distress.    Appearance: Normal appearance. She is not ill-appearing, toxic-appearing or diaphoretic.  HENT:     Head: Normocephalic.     Right Ear: Tympanic membrane slightly dull, but ear canal and external ear normal.     Left Ear: Tympanic membrane slightly dull, but ear canal and external ear normal.     Nose: with congestion and clear mucous. Has sinus tenderness on ethmoids and Frontal which is the same pain with the HA's she has been having     Mouth/Throat: clear    Mouth: Mucous membranes are moist.  Eyes:     General: No scleral icterus.       Right eye: No discharge.        Left eye: No discharge.     Conjunctiva/sclera: Conjunctivae normal.  Neck:     Musculoskeletal: Neck supple. No neck rigidity.  Cardiovascular:     Rate and Rhythm: Normal rate and regular rhythm.     Heart sounds: No murmur.  Pulmonary:     Effort: Pulmonary effort is normal.     Breath sounds: Normal breath sounds.   Musculoskeletal: Normal range of motion.  Lymphadenopathy:     Cervical: No cervical adenopathy.  Skin:    General: Skin is warm and dry.     Coloration: Skin is not jaundiced.     Findings: No rash.  Neurological:     Mental Status: She is alert and oriented to person, place, and time.     Gait: Gait normal.  Psychiatric:        Mood and Affect: Mood normal.         Behavior: Behavior normal.        Thought Content: Thought content normal.        Judgment:  Judgment normal.   UC Treatments / Results  Labs (all labs ordered are listed, but only abnormal results are displayed) Labs Reviewed  NOVEL CORONAVIRUS, NAA    EKG   Radiology No results found.  Procedures Procedures (including critical care time)  Medications Ordered in UC Medications - No data to display  Initial Impression / Assessment and Plan / UC Course  I have reviewed the triage vital signs and the nursing notes. Covid suspect Sinus HA SOM  Covid test is pending  I placed her on sudafed, tessalon and Meclizine as noted. See instructions     Final Clinical Impressions(s) / UC Diagnoses   Final diagnoses:  Encounter for screening for COVID-19  Suspected COVID-19 virus infection  Sinus headache     Discharge Instructions      Stay quarantined til your results are back. Per CDC you are to stay home for 5 days from onset of symptoms and then after that you may leave the house but should wear a mask for 5 more days. Try to not stay laying down and walk Drink Pedialyte while sweating .  If your Covid test ends up positive you may take the following supplements to help your immune system be stronger to fight this viral infection Take Quarcetin 500 mg three times a day x 7 days with Zinc 50 mg ones a day x 7 days. The quarcetin is an antiviral and anti-inflammatory supplement which helps open the zinc channels in the cell to absorb Zinc. Zinc helps decrease the virus load in your body. Take Melatonin 6-10 mg at bed time which also helps support your immune system.  Also make sure to take Vit D 5,000 IU per day with a fatty meal and Vit C 5000 mg a day until you are completely better. To prevent viral illnesses your vitamin D should be between 60-80. Stay on Vitamin D 2,000  and C  1000 mg the rest of the season.  Don't lay around, keep active and walk as much as  you are able to to prevent worsening of your symptoms.  Follow up with your family Dr next week.  If you get short of breath and you are able to check  your oxygen with a pulse oxygen meter, if it gets to 92% or less, you need to go to the hospital to be admitted. If you dont have one, come back here and we will assess you.       ED Prescriptions     Medication Sig Dispense Auth. Provider   pseudoephedrine (SUDAFED 12 HOUR) 120 MG 12 hr tablet Take 1 tablet (120 mg total) by mouth 2 (two) times daily. 14 tablet Rodriguez-Southworth, Sunday Spillers, PA-C   meclizine (ANTIVERT) 25 MG tablet Take 1 tablet (25 mg total) by mouth 3 (three) times daily as needed for dizziness. For dizziness 30 tablet Rodriguez-Southworth, Sunday Spillers, PA-C   benzonatate (TESSALON) 200 MG capsule Take 1 capsule (200 mg total) by mouth 2 (two) times daily as needed for cough. 30 capsule Rodriguez-Southworth, Sunday Spillers, PA-C      PDMP not reviewed this encounter.   Julie Mattocks, PA-C 04/13/21 0914    Rodriguez-Southworth, Sunday Spillers, PA-C 04/13/21 [phone]

## 2021-04-13 NOTE — Discharge Instructions (Addendum)
Stay quarantined til your results are back. Per CDC you are to stay home for 5 days from onset of symptoms and then after that you may leave the house but should wear a mask for 5 more days. Try to not stay laying down and walk Drink Pedialyte while sweating .  If your Covid test ends up positive you may take the following supplements to help your immune system be stronger to fight this viral infection Take Quarcetin 500 mg three times a day x 7 days with Zinc 50 mg ones a day x 7 days. The quarcetin is an antiviral and anti-inflammatory supplement which helps open the zinc channels in the cell to absorb Zinc. Zinc helps decrease the virus load in your body. Take Melatonin 6-10 mg at bed time which also helps support your immune system.  Also make sure to take Vit D 5,000 IU per day with a fatty meal and Vit C 5000 mg a day until you are completely better. To prevent viral illnesses your vitamin D should be between 60-80. Stay on Vitamin D 2,000  and C  1000 mg the rest of the season.  Don't lay around, keep active and walk as much as you are able to to prevent worsening of your symptoms.  Follow up with your family Dr next week.  If you get short of breath and you are able to check  your oxygen with a pulse oxygen meter, if it gets to 92% or less, you need to go to the hospital to be admitted. If you dont have one, come back here and we will assess you.

## 2021-04-14 LAB — NOVEL CORONAVIRUS, NAA: SARS-CoV-2, NAA: DETECTED — AB

## 2021-04-14 LAB — SARS-COV-2, NAA 2 DAY TAT

## 2022-01-01 ENCOUNTER — Other Ambulatory Visit: Payer: Self-pay | Admitting: Podiatry

## 2022-01-14 ENCOUNTER — Encounter: Payer: Self-pay | Admitting: Anesthesiology

## 2022-01-14 ENCOUNTER — Encounter: Payer: Self-pay | Admitting: Podiatry

## 2022-01-16 NOTE — Discharge Instructions (Signed)
[facility]  POST OPERATIVE INSTRUCTIONS FOR DR. Vickki Muff AND DR. [place]   Take your medication as prescribed.  Pain medication should be taken only as needed.  Keep the dressing clean, dry and intact.  Keep your foot elevated above the heart level for the first 48 hours.  Walking to the bathroom and brief periods of walking are acceptable, unless we have instructed you to be non-weight bearing.  Always wear your post-op shoe when walking.  Always use your crutches if you are to be non-weight bearing.  Do not take a shower. Baths are permissible as long as the foot is kept out of the water.   Every hour you are awake:  Bend your knee 15 times. Flex foot 15 times Massage calf 15 times  Call [facility] [phone]) if any of the following problems occur: You develop a temperature or fever. The bandage becomes saturated with blood. Medication does not stop your pain. Injury of the foot occurs. Any symptoms of infection including redness, odor, or red streaks running from wound.

## 2022-01-21 ENCOUNTER — Ambulatory Visit: Admission: RE | Admit: 2022-01-21 | Payer: BC Managed Care – PPO | Source: Home / Self Care | Admitting: Podiatry

## 2022-01-21 SURGERY — BUNIONECTOMY
Anesthesia: Choice | Site: Toe | Laterality: Right

## 2022-08-20 ENCOUNTER — Emergency Department: Payer: Medicaid Other

## 2022-08-20 ENCOUNTER — Other Ambulatory Visit: Payer: Self-pay

## 2022-08-20 ENCOUNTER — Inpatient Hospital Stay
Admission: EM | Admit: 2022-08-20 | Discharge: 2022-08-23 | DRG: 390 | Disposition: A | Discharge status: Home / Self Care | Payer: Medicaid Other | Attending: Surgery | Admitting: Surgery

## 2022-08-20 DIAGNOSIS — K56600 Partial intestinal obstruction, unspecified as to cause: Principal | ICD-10-CM | POA: Diagnosis present

## 2022-08-20 DIAGNOSIS — K56609 Unspecified intestinal obstruction, unspecified as to partial versus complete obstruction: Secondary | ICD-10-CM

## 2022-08-20 DIAGNOSIS — R1110 Vomiting, unspecified: Secondary | ICD-10-CM

## 2022-08-20 DIAGNOSIS — Z932 Ileostomy status: Secondary | ICD-10-CM

## 2022-08-20 DIAGNOSIS — R935 Abnormal findings on diagnostic imaging of other abdominal regions, including retroperitoneum: Secondary | ICD-10-CM | POA: Diagnosis present

## 2022-08-20 DIAGNOSIS — Z79899 Other long term (current) drug therapy: Secondary | ICD-10-CM

## 2022-08-20 DIAGNOSIS — Z8719 Personal history of other diseases of the digestive system: Secondary | ICD-10-CM

## 2022-08-20 DIAGNOSIS — E876 Hypokalemia: Secondary | ICD-10-CM | POA: Diagnosis present

## 2022-08-20 DIAGNOSIS — R197 Diarrhea, unspecified: Secondary | ICD-10-CM

## 2022-08-20 DIAGNOSIS — Z9049 Acquired absence of other specified parts of digestive tract: Secondary | ICD-10-CM

## 2022-08-20 DIAGNOSIS — K900 Celiac disease: Secondary | ICD-10-CM | POA: Diagnosis present

## 2022-08-20 LAB — GASTROINTESTINAL PANEL BY PCR, STOOL (REPLACES STOOL CULTURE)

## 2022-08-20 LAB — URINALYSIS, ROUTINE W REFLEX MICROSCOPIC
Bacteria, UA: NONE SEEN
Bilirubin Urine: NEGATIVE
Glucose, UA: NEGATIVE mg/dL
Hgb urine dipstick: NEGATIVE
Ketones, ur: NEGATIVE mg/dL
Leukocytes,Ua: NEGATIVE
Nitrite: NEGATIVE
Protein, ur: 30 mg/dL — AB
Specific Gravity, Urine: 1.023 (ref 1.005–1.030)
WBC, UA: NONE SEEN WBC/hpf (ref 0–5)
pH: 7 (ref 5.0–8.0)

## 2022-08-20 LAB — CBC WITH DIFFERENTIAL/PLATELET
Abs Immature Granulocytes: 0.07 10*3/uL (ref 0.00–0.07)
Basophils Absolute: 0.1 10*3/uL (ref 0.0–0.1)
Basophils Relative: 0 %
Eosinophils Absolute: 0.2 10*3/uL (ref 0.0–0.5)
Eosinophils Relative: 1 %
HCT: 36.7 % (ref 36.0–46.0)
Hemoglobin: 11.4 g/dL — ABNORMAL LOW (ref 12.0–15.0)
Immature Granulocytes: 0 %
Lymphocytes Relative: 11 %
Lymphs Abs: 2.3 10*3/uL (ref 0.7–4.0)
MCH: 26.8 pg (ref 26.0–34.0)
MCHC: 31.1 g/dL (ref 30.0–36.0)
MCV: 86.2 fL (ref 80.0–100.0)
Monocytes Absolute: 1.3 10*3/uL — ABNORMAL HIGH (ref 0.1–1.0)
Monocytes Relative: 7 %
Neutro Abs: 16.5 10*3/uL — ABNORMAL HIGH (ref 1.7–7.7)
Neutrophils Relative %: 81 %
Platelets: 503 10*3/uL — ABNORMAL HIGH (ref 150–400)
RBC: 4.26 MIL/uL (ref 3.87–5.11)
RDW: 13.4 % (ref 11.5–15.5)
WBC: 20.4 10*3/uL — ABNORMAL HIGH (ref 4.0–10.5)
nRBC: 0 % (ref 0.0–0.2)

## 2022-08-20 LAB — COMPREHENSIVE METABOLIC PANEL
ALT: 116 U/L — ABNORMAL HIGH (ref 0–44)
AST: 88 U/L — ABNORMAL HIGH (ref 15–41)
Albumin: 4.1 g/dL (ref 3.5–5.0)
Alkaline Phosphatase: 63 U/L (ref 38–126)
Anion gap: 11 (ref 5–15)
BUN: 11 mg/dL (ref 6–20)
CO2: 24 mmol/L (ref 22–32)
Calcium: 9.1 mg/dL (ref 8.9–10.3)
Chloride: 101 mmol/L (ref 98–111)
Creatinine, Ser: 0.65 mg/dL (ref 0.44–1.00)
GFR, Estimated: >60 mL/min (ref >60)
Glucose, Bld: 106 mg/dL — ABNORMAL HIGH (ref 70–99)
Potassium: 3 mmol/L — ABNORMAL LOW (ref 3.5–5.1)
Sodium: 136 mmol/L (ref 135–145)
Total Bilirubin: 0.7 mg/dL (ref 0.3–1.2)
Total Protein: 9.2 g/dL — ABNORMAL HIGH (ref 6.5–8.1)

## 2022-08-20 LAB — LIPASE, BLOOD: Lipase: 40 U/L (ref 11–51)

## 2022-08-20 LAB — C DIFFICILE QUICK SCREEN W PCR REFLEX
C Diff antigen: NEGATIVE
C Diff interpretation: NOT DETECTED
C Diff toxin: NEGATIVE

## 2022-08-20 LAB — HCG, QUANTITATIVE, PREGNANCY: hCG, Beta Chain, Quant, S: <1 m[IU]/mL (ref <5)

## 2022-08-20 LAB — PROCALCITONIN: Procalcitonin: <0.1 ng/mL

## 2022-08-20 MED ORDER — ONDANSETRON 4 MG PO TBDP: 4.0000 mg | ORAL_TABLET | Freq: Four times a day (QID) | ORAL | Status: DC | PRN

## 2022-08-20 MED ORDER — ONDANSETRON HCL 4 MG/2ML IJ SOLN
4.0000 mg | Freq: Four times a day (QID) | INTRAMUSCULAR | Status: DC | PRN
  Administered 2022-08-21: 4 mg via INTRAVENOUS
  Filled 2022-08-20: qty 2

## 2022-08-20 MED ORDER — ACETAMINOPHEN 325 MG PO TABS: 650.0000 mg | ORAL_TABLET | Freq: Four times a day (QID) | ORAL | Status: DC | PRN

## 2022-08-20 MED ORDER — LACTATED RINGERS IV BOLUS
1000.0000 mL | Freq: Once | INTRAVENOUS | Status: AC
  Administered 2022-08-20: 1000 mL via INTRAVENOUS

## 2022-08-20 MED ORDER — PANTOPRAZOLE SODIUM 40 MG IV SOLR
40.0000 mg | Freq: Every day | INTRAVENOUS | Status: DC
  Administered 2022-08-20 – 2022-08-22 (×3): 40 mg via INTRAVENOUS
  Filled 2022-08-20 (×3): qty 10

## 2022-08-20 MED ORDER — MELATONIN 5 MG PO TABS
5.0000 mg | ORAL_TABLET | Freq: Every evening | ORAL | Status: DC | PRN
  Filled 2022-08-20: qty 1

## 2022-08-20 MED ORDER — IOHEXOL 300 MG/ML  SOLN
100.0000 mL | Freq: Once | INTRAMUSCULAR | Status: AC | PRN
  Administered 2022-08-20: 100 mL via INTRAVENOUS

## 2022-08-20 MED ORDER — ONDANSETRON HCL 4 MG/2ML IJ SOLN
4.0000 mg | Freq: Once | INTRAMUSCULAR | Status: AC
  Administered 2022-08-20: 4 mg via INTRAVENOUS
  Filled 2022-08-20: qty 2

## 2022-08-20 MED ORDER — POTASSIUM CHLORIDE 10 MEQ/100ML IV SOLN
10.0000 meq | INTRAVENOUS | Status: AC
  Administered 2022-08-20: 10 meq via INTRAVENOUS
  Filled 2022-08-20: qty 100

## 2022-08-20 MED ORDER — KCL IN DEXTROSE-NACL 20-5-0.45 MEQ/L-%-% IV SOLN
INTRAVENOUS | Status: DC
  Filled 2022-08-20 (×6): qty 1000

## 2022-08-20 MED ORDER — MESALAMINE 1000 MG RE SUPP
1000.0000 mg | Freq: Every day | RECTAL | Status: DC
  Administered 2022-08-20 – 2022-08-22 (×3): 1000 mg via RECTAL
  Filled 2022-08-20 (×3): qty 1

## 2022-08-20 NOTE — Plan of Care (Signed)

## 2022-08-20 NOTE — ED Triage Notes (Signed)
Pt sts that she has celiac disease and has been vomiting. Pt sts that she ate breakfast this AM and thinks that she had regular pancakes.

## 2022-08-20 NOTE — H&P (Signed)
Patient ID: Julie Bentley, female   DOB: [birth year], 27 y.o.   MRN: [id number]  Chief Complaint: Nausea/vomiting  History of Present Illness Julie Bentley is a 27 y.o. 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  She also has a history of celiac disease and so her story begins with some gluten-free pancakes from [employer].  She had later nausea following some bowel activity.  Eventually some emesis, but never bilious, never bloody.  Some suprapubic area/pelvic pain/cramping.  Reports feeling significant better following workup, IV fluids and meds. Mother at bedside.  Past Medical History Past Medical History:  Diagnosis Date   Celiac disease    Ileostomy in place ([place])    UC (ulcerative colitis) ([place])       Past Surgical History:  Procedure Laterality Date   COLECTOMY     FLEXIBLE SIGMOIDOSCOPY N/A 01/29/2019   Procedure: FLEXIBLE SIGMOIDOSCOPY;  Surgeon: Jonathon Bellows, MD;  Location: [facility] ENDOSCOPY;  Service: Gastroenterology;  Laterality: N/A;    Allergies  Allergen Reactions   Gluten Meal     No current facility-administered medications for this encounter.   Current Outpatient Medications  Medication Sig Dispense Refill   acetaminophen (TYLENOL) 325 MG tablet Take 2 tablets (650 mg total) by mouth every 6 (six) hours as needed for mild pain (or Fever >/= 101).     benzonatate (TESSALON) 200 MG capsule Take 1 capsule (200 mg total) by mouth 2 (two) times daily as needed for cough. (Patient not taking: Reported on 01/14/2022) 30 capsule 0   cetirizine (ZYRTEC) 10 MG tablet Take 1 tablet by mouth daily.     meclizine (ANTIVERT) 25 MG tablet Take 1 tablet (25 mg total) by mouth 3 (three) times daily as needed for dizziness. For dizziness (Patient not taking: Reported on 01/14/2022) 30 tablet 0   mesalamine (CANASA) 1000 MG suppository Place 1,000 mg rectally at bedtime.     Multiple Vitamins-Minerals (CENTRUM MULTI + OMEGA 3 PO)  Take 1 tablet by mouth daily.     pseudoephedrine (SUDAFED 12 HOUR) 120 MG 12 hr tablet Take 1 tablet (120 mg total) by mouth 2 (two) times daily. (Patient not taking: Reported on 01/14/2022) 14 tablet 0   Vitamin D, Ergocalciferol, (DRISDOL) 1.25 MG (50000 UT) CAPS capsule Take 50,000 Units by mouth every 7 (seven) days. (Patient not taking: Reported on 01/14/2022)      Family History No family history on file.    Social History Social History   Tobacco Use   Smoking status: Never   Smokeless tobacco: Never  Substance Use Topics   Alcohol use: No        Review of Systems  All other systems reviewed and are negative.    Physical Exam Blood pressure 110/68, pulse 91, temperature 99.7 F (37.6 C), resp. rate 19, weight 56.7 kg, last menstrual period 08/05/2022, SpO2 100 %. Last Weight  Most recent update: 08/20/2022 12:14 PM    Weight  56.7 kg (125 lb)             CONSTITUTIONAL: Well developed, and nourished, appropriately responsive and aware without distress.   EYES: Sclera non-icteric.   EARS, NOSE, MOUTH AND THROAT:  The oropharynx is clear. Oral mucosa is pink and moist.    Hearing is intact to voice.  NECK: Trachea is midline, and there is no jugular venous distension.  LYMPH NODES:  Lymph nodes in the neck are not appreciated. RESPIRATORY:  Lungs are clear, and  breath sounds are equal bilaterally.  Normal respiratory effort without pathologic use of accessory muscles. CARDIOVASCULAR: Heart is regular in rate and rhythm.  Well perfused.  GI: The abdomen is soft, nontender, and nondistended. There were no palpable masses.  MUSCULOSKELETAL:  Symmetrical muscle tone appreciated in all four extremities.    SKIN: Skin turgor is normal. No pathologic skin lesions appreciated.  NEUROLOGIC:  Motor and sensation appear grossly normal.  Cranial nerves are grossly without defect. PSYCH:  Alert and oriented to person, place and time. Affect is appropriate for situation.  Data  Reviewed I have personally reviewed what is currently available of the patient's imaging, recent labs and medical records.   Labs:     Latest Ref Rng & Units 08/20/2022   12:45 PM 02/06/2019    4:37 AM 02/04/2019    4:47 AM  CBC  WBC 4.0 - 10.5 K/uL 20.4  14.4  12.0   Hemoglobin 12.0 - 15.0 g/dL 11.4  11.0  11.0   Hematocrit 36.0 - 46.0 % 36.7  33.8  33.8   Platelets 150 - 400 K/uL 503  387  368       Latest Ref Rng & Units 08/20/2022   12:45 PM 02/04/2019    4:47 AM 02/03/2019    5:13 AM  CMP  Glucose 70 - 99 mg/dL 106  90  82   BUN 6 - 20 mg/dL 11  18  16   $ Creatinine 0.44 - 1.00 mg/dL 0.65  0.62  0.61   Sodium 135 - 145 mmol/L 136  137  139   Potassium 3.5 - 5.1 mmol/L 3.0  3.7  3.5   Chloride 98 - 111 mmol/L 101  101  102   CO2 22 - 32 mmol/L 24  29  28   $ Calcium 8.9 - 10.3 mg/dL 9.1  8.5  8.8   Total Protein 6.5 - 8.1 g/dL 9.2     Total Bilirubin 0.3 - 1.2 mg/dL 0.7     Alkaline Phos 38 - 126 U/L 63     AST 15 - 41 U/L 88     ALT 0 - 44 U/L 116         Imaging: Radiological images reviewed:   Within last 24 hrs: CT ABDOMEN PELVIS W CONTRAST  Result Date: 08/20/2022 CLINICAL DATA:  LEFT lower quadrant abdominal pain, ulcerative colitis, diarrhea, vomiting, has J-pouch EXAM: CT ABDOMEN AND PELVIS WITH CONTRAST TECHNIQUE: Multidetector CT imaging of the abdomen and pelvis was performed using the standard protocol following bolus administration of intravenous contrast. RADIATION DOSE REDUCTION: This exam was performed according to the departmental dose-optimization program which includes automated exposure control, adjustment of the mA and/or kV according to patient size and/or use of iterative reconstruction technique. CONTRAST:  128m OMNIPAQUE IOHEXOL 300 MG/ML SOLN IV. No oral contrast. COMPARISON:  01/27/2019 FINDINGS: Lower chest: Lung bases clear Hepatobiliary: Gallbladder and liver normal appearance Pancreas: Normal appearance Spleen: Normal appearance Adrenals/Urinary  Tract: Adrenal glands, kidneys, ureters, and bladder normal appearance. No urinary tract calcification or dilatation. Stomach/Bowel: Prior colectomy with ileoanal anastomosis. No definite bowel wall thickening. Dilated small bowel loop in the LEFT upper quadrant 6.4 cm diameter associated with swirling of the mesenteric vessels. This dilated loop appears anterior to the stomach, new. Possibility of an internal hernia or closed loop obstruction is raised. Stomach and remaining bowel loops unremarkable. Vascular/Lymphatic: Vascular structures patent. Several small RIGHT perirectal nodules/nodes are identified, measuring 7 mm, 7 mm, and 5 mm in sizes,  nonspecific but new. Reproductive: Unremarkable uterus and RIGHT adnexa. Complex cystic lesion LEFT ovary 4.4 x 3.4 x 4.4 cm containing septations and irregularity. Other: No free air or free fluid. No hernia. Small amount of edema in presacral space inferiorly. Musculoskeletal: Osseous structures unremarkable. IMPRESSION: Prior colectomy with ileoanal anastomosis. Dilated small bowel loop in the LEFT upper quadrant 6.4 cm diameter associated with swirling of the mesenteric vessels, located anterior to the stomach, new; possibility of an internal hernia or closed loop obstruction is raised. Several small RIGHT perirectal nodules/nodes are identified, nonspecific but new. Complex cystic lesion LEFT ovary 4.4 x 3.4 x 4.4 cm containing septations and irregularity; recommend further characterization by pelvic and transvaginal ultrasound. Electronically Signed   By: Lavonia Dana M.D.   On: 08/20/2022 15:45    Assessment    Abnormally dilated loop of small bowel in the left upper quadrant, with some mesenteric findings suspicious for internal hernia versus torsion.  However the patient is currently comfortable, nontender and without persisting nausea or vomiting. Patient Active Problem List   Diagnosis Date Noted   Ulcerative colitis, acute ([place]) 01/27/2019    Malnutrition of moderate degree 07/29/2015   Anemia 07/26/2015   UC (ulcerative colitis) ([place]) 07/26/2015   Celiac disease 07/26/2015    Plan    Her stomach is nondilated nor does she have evidence of proximal small bowel dilatation.  So I do not believe an NG tube would be terribly helpful. It is concerning a loop of small bowel in the left upper quadrant as dilated to over 6 cm.  I believe it is worthwhile to observe her, ensure tolerance of oral intake, consider Gastrografin challenge/small bowel follow-through should dilated loop persist on KUB and more in the morning. Face-to-face time spent with the patient and accompanying care providers(if present) was 45 minutes, with more than 50% of the time spent counseling, educating, and coordinating care of the patient.    These notes generated with voice recognition software. I apologize for typographical errors.  Ronny Bacon M.D., FACS 08/20/2022, 7:21 PM

## 2022-08-20 NOTE — ED Provider Notes (Signed)
[facility] Provider Note    Event Date/Time   First MD Initiated Contact with Patient 08/20/22 1221     (approximate)   History   Emesis   HPI  Luda Vandeputte is a 27 y.o. 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  Patient had [employer] this morning ordered gluten-free pancakes but thinks they may have had gluten in them.  After she got to work she had diarrhea when she was sitting on the toilet she developed nausea and has had 3 episodes of emesis.  Has a feeling of ongoing nausea like she is going to vomit and sensation that she needs to have a bowel movement.  She has had some crampy lower abdominal pain but says that the nausea is worse with the pain.  Unsure if there is any rectal bleeding.  She has been having some rectal bleeding but is currently on mesalamine suppository prescribed by her physician.  Denies fevers or chills.  Has felt lightheaded since onset of symptoms.    Past Medical History:  Diagnosis Date   Celiac disease    Ileostomy in place [facility])    UC (ulcerative colitis) [facility])     Patient Active Problem List   Diagnosis Date Noted   Ulcerative colitis, acute ([place]) 01/27/2019   Malnutrition of moderate degree 07/29/2015   Anemia 07/26/2015   UC (ulcerative colitis) ([place]) 07/26/2015   Celiac disease 07/26/2015     Physical Exam  Triage Vital Signs: ED Triage Vitals [08/20/22 1214]  Enc Vitals Group     BP 122/86     Pulse Rate (!) 115     Resp 17     Temp 98.1 F (36.7 C)     Temp Source Oral     SpO2 99 %     Weight 125 lb (56.7 kg)     Height      Head Circumference      Peak Flow      Pain Score 5     Pain Loc      Pain Edu?      Excl. in [place]?     Most recent vital signs: Vitals:   08/20/22 1720 08/20/22 1721  BP: 110/68   Pulse: 91 91  Resp: 18 19  Temp: 99.7 F (37.6 C)   SpO2: 100% 100%      General: Awake, patient actively vomiting CV:  Good peripheral perfusion.  Resp:  Normal effort.  Abd:  No distention.  Mild periumbilical tenderness but abdomen is soft without guarding Neuro:             Awake, Alert, Oriented x 3  Other:     ED Results / Procedures / Treatments  Labs (all labs ordered are listed, but only abnormal results are displayed) Labs Reviewed  COMPREHENSIVE METABOLIC PANEL - Abnormal; Notable for the following components:      Result Value   Potassium 3.0 (*)    Glucose, Bld 106 (*)    Total Protein 9.2 (*)    AST 88 (*)    ALT 116 (*)    All other components within normal limits  CBC WITH DIFFERENTIAL/PLATELET - Abnormal; Notable for the following components:   WBC 20.4 (*)    Hemoglobin 11.4 (*)    Platelets 503 (*)    Neutro Abs 16.5 (*)    Monocytes Absolute 1.3 (*)    All  other components within normal limits  URINALYSIS, ROUTINE W REFLEX MICROSCOPIC - Abnormal; Notable for the following components:   Color, Urine YELLOW (*)    APPearance HAZY (*)    Protein, ur 30 (*)    All other components within normal limits  GASTROINTESTINAL PANEL BY PCR, STOOL (REPLACES STOOL CULTURE)  C DIFFICILE QUICK SCREEN W PCR REFLEX    LIPASE, BLOOD  PROCALCITONIN  HCG, QUANTITATIVE, PREGNANCY  POC URINE PREG, ED     EKG    RADIOLOGY I reviewed and interpreted the CT of the abdomen pelvis which shows dilated loops of bowel in the upper abdomen   PROCEDURES:  Critical Care performed: No  Procedures     MEDICATIONS ORDERED IN ED: Medications  potassium chloride 10 mEq in 100 mL IVPB (0 mEq Intravenous Stopped 08/20/22 1653)  lactated ringers bolus 1,000 mL (0 mLs Intravenous Stopped 08/20/22 1328)  ondansetron (ZOFRAN) injection 4 mg (4 mg Intravenous Given 08/20/22 1249)  iohexol (OMNIPAQUE) 300 MG/ML solution 100 mL (100 mLs Intravenous Contrast Given 08/20/22 1521)     IMPRESSION / MDM / ASSESSMENT AND PLAN / ED COURSE  I reviewed  the triage vital signs and the nursing notes.                              Patient's presentation is most consistent with acute complicated illness / injury requiring diagnostic workup.  Differential diagnosis includes, but is not limited to, gluten reaction, gastroenteritis, bowel obstruction, exacerbation of UC, pancreatitis, biliary colic, pyelonephritis, pregnancy  Patient is a 27 year old female with history of severe ulcerative colitis status post J-pouch and celiac disease who presents with nausea vomiting diarrhea.  Occurred at work this morning.  Started with diarrhea and is subsequently developed nonbloody nonbilious emesis as well as some abdominal cramping and lightheadedness.  She ate pancakes this morning which she ordered gluten-free but thinks they could have had gluten in them.  Patient does have history of UC and is on mesalamine suppository for some rectal bleeding currently.  On exam she is initially tachycardic vitals are otherwise reassuring.  She is actively vomiting when I am in the room.  Abdominal exam overall is benign she does have some mild periumbilical tenderness but no guarding.  Patient tells me that the nausea and vomiting are much more severe than the pain.  Will proceed with CBC CMP lipase pregnancy test and urinalysis and place line and give IV Zofran.  On reassessment patient looks much improved and is feeling improved.  On repeat palpation of the abdomen she does have some left lower quadrant tenderness.  She has a leukocytosis to 20 hypokalemic with potassium of 3 AST and ALT mildly elevated.  When looked back on prior labs in Care Everywhere AST and ALT have been elevated in the past as well.  With her complex medical history and this leukocytosis with tenderness on exam will obtain a CT of the abdomen pelvis with contrast.  Will give IV potassium.  CT of the abdomen pelvis is read as radiology as possible internal hernia due to swelling of the mesentery.  On  rexamination of the patient she has a nontender abdomen feels much improved.  Low suspicion for closed-loop bowel obstruction given patient's overall clinical appearance but I did consult Dr. Christian Mate with general surgery who evaluated the patient, and plans to admit for observation and Gastrografin test.      FINAL CLINICAL IMPRESSION(S) / ED  DIAGNOSES   Final diagnoses:  Vomiting and diarrhea     Rx / DC Orders   ED Discharge Orders     None        Note:  This document was prepared using Dragon voice recognition software and may include unintentional dictation errors.   Rada Hay, MD 08/20/22 [phone]

## 2022-08-21 ENCOUNTER — Encounter: Payer: Self-pay | Admitting: Surgery

## 2022-08-21 ENCOUNTER — Inpatient Hospital Stay: Payer: Medicaid Other

## 2022-08-21 ENCOUNTER — Observation Stay: Payer: Medicaid Other

## 2022-08-21 DIAGNOSIS — K56609 Unspecified intestinal obstruction, unspecified as to partial versus complete obstruction: Secondary | ICD-10-CM

## 2022-08-21 DIAGNOSIS — K900 Celiac disease: Secondary | ICD-10-CM | POA: Diagnosis present

## 2022-08-21 DIAGNOSIS — R935 Abnormal findings on diagnostic imaging of other abdominal regions, including retroperitoneum: Secondary | ICD-10-CM | POA: Diagnosis not present

## 2022-08-21 DIAGNOSIS — Z9049 Acquired absence of other specified parts of digestive tract: Secondary | ICD-10-CM | POA: Diagnosis not present

## 2022-08-21 DIAGNOSIS — E876 Hypokalemia: Secondary | ICD-10-CM | POA: Diagnosis present

## 2022-08-21 DIAGNOSIS — Z8719 Personal history of other diseases of the digestive system: Secondary | ICD-10-CM | POA: Diagnosis not present

## 2022-08-21 DIAGNOSIS — K56600 Partial intestinal obstruction, unspecified as to cause: Secondary | ICD-10-CM | POA: Diagnosis not present

## 2022-08-21 DIAGNOSIS — Z79899 Other long term (current) drug therapy: Secondary | ICD-10-CM | POA: Diagnosis not present

## 2022-08-21 DIAGNOSIS — Z932 Ileostomy status: Secondary | ICD-10-CM | POA: Diagnosis not present

## 2022-08-21 LAB — CBC
HCT: 28.7 % — ABNORMAL LOW (ref 36.0–46.0)
Hemoglobin: 9 g/dL — ABNORMAL LOW (ref 12.0–15.0)
MCH: 26.3 pg (ref 26.0–34.0)
MCHC: 31.4 g/dL (ref 30.0–36.0)
MCV: 83.9 fL (ref 80.0–100.0)
Platelets: 368 10*3/uL (ref 150–400)
RBC: 3.42 MIL/uL — ABNORMAL LOW (ref 3.87–5.11)
RDW: 13.2 % (ref 11.5–15.5)
WBC: 7 10*3/uL (ref 4.0–10.5)
nRBC: 0 % (ref 0.0–0.2)

## 2022-08-21 LAB — BASIC METABOLIC PANEL
Anion gap: 5 (ref 5–15)
BUN: 7 mg/dL (ref 6–20)
CO2: 26 mmol/L (ref 22–32)
Calcium: 8.3 mg/dL — ABNORMAL LOW (ref 8.9–10.3)
Chloride: 104 mmol/L (ref 98–111)
Creatinine, Ser: 0.63 mg/dL (ref 0.44–1.00)
GFR, Estimated: >60 mL/min (ref >60)
Glucose, Bld: 97 mg/dL (ref 70–99)
Potassium: 3.2 mmol/L — ABNORMAL LOW (ref 3.5–5.1)
Sodium: 135 mmol/L (ref 135–145)

## 2022-08-21 LAB — HIV ANTIBODY (ROUTINE TESTING W REFLEX): HIV Screen 4th Generation wRfx: NONREACTIVE

## 2022-08-21 MED ORDER — DIATRIZOATE MEGLUMINE & SODIUM 66-10 % PO SOLN: 90.0000 mL | Freq: Once | ORAL | Status: DC

## 2022-08-21 MED ORDER — DIATRIZOATE MEGLUMINE & SODIUM 66-10 % PO SOLN
90.0000 mL | Freq: Once | ORAL | Status: AC
  Administered 2022-08-21: 90 mL via ORAL

## 2022-08-21 MED ORDER — ORAL CARE MOUTH RINSE: 15.0000 mL | OROMUCOSAL | Status: DC | PRN

## 2022-08-21 NOTE — Progress Notes (Addendum)
[facility] Day(s): 0.   Interval History:  Patient seen and examined No acute events or new complaints overnight.  Patient reports she is doing well this morning No abdominal pain, nausea, emesis, fever, chills She did have a leukocytosis to 20.4K on admission but thi resolved (now 7.0K) without Abx Hgb to 9.0; suspect this is dilutional Renal function normal; sCr - 0.63; UO - unmeasured Hypokalemia to 3.2 She did have repeat KUB this morning still concerning ofr dilated loop in LUQ   Vital signs in last 24 hours: [min-max] current  Temp:  [97.9 F (36.6 C)-99.7 F (37.6 C)] 98.6 F (37 C) (02/23 0746) Pulse Rate:  [68-115] 68 (02/23 0746) Resp:  [16-19] 18 (02/23 0746) BP: (95-122)/(64-86) 98/64 (02/23 0746) SpO2:  [99 %-100 %] 99 % (02/23 0746) Weight:  [56.7 kg-57 kg] 57 kg (02/22 2110)     Height: '5\' 5"'$  (165.1 cm) Weight: 57 kg BMI (Calculated): 20.91   Intake/Output last 2 shifts:  02/22 0701 - 02/23 0700 In: 1189.5 [I.V.:89.5; IV Piggyback:1100] Out: 2 [Urine:2]   Physical Exam:  Constitutional: alert, cooperative and no distress  Respiratory: breathing non-labored at rest  Cardiovascular: regular rate and sinus rhythm  Gastrointestinal: soft, non-tender, and non-distended. No rebound/guarding. She is certainly without peritonitis  Integumentary: warm, dry   Labs:     Latest Ref Rng & Units 08/21/2022    2:52 AM 08/20/2022   12:45 PM 02/06/2019    4:37 AM  CBC  WBC 4.0 - 10.5 K/uL 7.0  20.4  14.4   Hemoglobin 12.0 - 15.0 g/dL 9.0  11.4  11.0   Hematocrit 36.0 - 46.0 % 28.7  36.7  33.8   Platelets 150 - 400 K/uL 368  503  387       Latest Ref Rng & Units 08/21/2022    2:52 AM 08/20/2022   12:45 PM 02/04/2019    4:47 AM  CMP  Glucose 70 - 99 mg/dL 97  106  90   BUN 6 - 20 mg/dL '7  11  18   '$ Creatinine 0.44 - 1.00 mg/dL 0.63  0.65  0.62   Sodium 135 - 145 mmol/L 135  136  137   Potassium 3.5 - 5.1 mmol/L 3.2   3.0  3.7   Chloride 98 - 111 mmol/L 104  101  101   CO2 22 - 32 mmol/L '26  24  29   '$ Calcium 8.9 - 10.3 mg/dL 8.3  9.1  8.5   Total Protein 6.5 - 8.1 g/dL  9.2    Total Bilirubin 0.3 - 1.2 mg/dL  0.7    Alkaline Phos 38 - 126 U/L  63    AST 15 - 41 U/L  88    ALT 0 - 44 U/L  116       Imaging studies:   KUB (08/21/2022) personally reviewed which shows unchanged loop of dilated small bowel in LUQ; no free air, and radiologist report reviewed below:  IMPRESSION: 1. Persistent dilated small bowel loops in the left upper quadrant and central abdomen, with a transition point in the left upper quadrant better shown on prior CT. This may be due to internal hernia or small bowel volvulus.   Assessment/Plan: (ICD-10's: K55.609) 27 y.o. female with significantly dilated loop of small bowel in the LUQ with history of total colectomy   - Given persistent dilation of small bowel on KUB this morning, we will proceed with gastrografin challenge  to reassess and ensure no complete obstruction/internal hernia - Will make her NPO pending gastrografin challenge  - Continue IVF support  - Still do not feel there is significant role for NGT - No emergent surgical intervention at this time; pending gastrografin challenge   - Monitor abdominal examination; on-going bowel function - Replete K+ - Pain control prn; antiemetics prn   - Mobilize    All of the above findings and recommendations were discussed with the patient, and the medical team, and all of patient's questions were answered to her expressed satisfaction.  -- Edison Simon, PA-C [facility] 08/21/2022, 9:46 AM M-F: 7am - 4pm

## 2022-08-21 NOTE — Progress Notes (Signed)
Pt's mother, Schacht, on speaker phone in room questioning which study is being done, RN explained that contrast was given to check for small bowel obstruction. Pt's mother shouting that pt does not have large bowel and there should not be a waiting period on the study. Mother then asks why the pt is being offered TPN, RN verbalized understanding of mother's concerns with TPN and stated that would be a question for the physician. Mother again shouting that RN should be able to answer these questions. Mother then stated she wanted RN to reach out to [facility], RN again stated this would be something that would need to be discussed with the surgery team who is the attending for her daughter. PA Zach notified to contact pt's mother Alterio with cell phone number provided by patient.

## 2022-08-21 NOTE — Progress Notes (Signed)
Asked by patient's RN to speak to patient's mother over concerns/questions regarding her daughters care. I spoke with the patient and her mother [facility]) on the phone for about 30 minutes this evening. Her mother's frustration seemed to come from feeling as if there was a lack of communication amongst the staff and teams. I did my bet to reiterate the reason Dennis Bast was admitted and what the purpose of our work up was (ie: gastrografin challenge) as to ensure we do not miss a true obstruction nor internal hernia. If this was negative, the plan was to restart diet and DC in the AM. At no time have we felt that she was in imminent danger nor required surgical intervention. Given her complex history, we wanted to ensure no missed intra-abdominal process. She was appreciative of that and noted that this plan has remained consistent from the surgical team from admission until now. She wanted to ensure that we knew the patient had a surgical and GI team at [facility] I was aware of this and noted that right now there was no reason for the patient to be transferred unless work up revealed anything requiring intervention in which case we would pursue transfer as long as there was no immediate danger to the patient. She agreed. She did express that her biggest frustration was that the patient was talked to about potentially needing TPN. Patient's mother notes a long standing history with her children and chronic GI diseases and is very familiar with this. She did not feel her daughter needed this and was frustrated by this being brought up, and again, the lack of communication regarding this amongst her daughter's medical team members. I did my best to reassure her that our plan did not include needing TPN given her overall well appearing clinical condition and our low suspicion that the patient would need to be NPO for a long time.   I allowed time for her to express her frustrations and concerns, which again seemed to be  centered around lack of communication amongst staff and teams. I did apologize for this, being on her primary team, and enforced that we will do our best to ensure all members of her team are up to date and "on the same page," especially regarding the plan.    I believe at the end of our discussion, she was appreciative of our phone call and time.   I did review the plan going forward which would include her follow up KUB with contrast to ensure no bowel obstruction. If this was negative, patient would be returned to a regular diet with the plan for DC home in the AM. Patient and her mother were both in agreement with this plan.  -- Edison Simon, PA-C [facility] 08/21/2022, 6:59 PM M-F: 7am - 4pm

## 2022-08-21 NOTE — TOC Initial Note (Signed)
Transition of Care [facility]) - Initial/Assessment Note    Patient Details  Name: Julie Bentley MRN: [id number] Date of Birth: [birth year]  Transition of Care [facility]) CM/SW Contact:    Beverly Sessions, RN Phone Number: 08/21/2022, 1:25 PM  Clinical Narrative:                  Transition of Care (TOC) Screening Note   Patient Details  Name: Julie Bentley Date of Birth: [birth year]   Transition of Care [facility]) CM/SW Contact:    Beverly Sessions, RN Phone Number: 08/21/2022, 1:25 PM    Transition of Care Department [facility]) has reviewed patient and no TOC needs have been identified at this time. We will continue to monitor patient advancement through interdisciplinary progression rounds. If new patient transition needs arise, please place a TOC consult.          Patient Goals and CMS Choice            Expected Discharge Plan and Services                                              Prior Living Arrangements/Services                       Activities of Daily Living Home Assistive Devices/Equipment: None ADL Screening (condition at time of admission) Patient's cognitive ability adequate to safely complete daily activities?: Yes Is the patient deaf or have difficulty hearing?: No Does the patient have difficulty seeing, even when wearing glasses/contacts?: No Does the patient have difficulty concentrating, remembering, or making decisions?: No Patient able to express need for assistance with ADLs?: Yes Does the patient have difficulty dressing or bathing?: No Independently performs ADLs?: No Communication: Independent Dressing (OT): Independent Grooming: Independent Feeding: Independent Bathing: Independent Toileting: Independent In/Out Bed: Independent Walks in Home: Independent Does the patient have difficulty walking or climbing stairs?: No Weakness of Legs: None Weakness of Arms/Hands: None  Permission Sought/Granted                   Emotional Assessment              Admission diagnosis:  Abnormal CT of the abdomen [R93.5] Vomiting and diarrhea [R11.10, R19.7] Patient Active Problem List   Diagnosis Date Noted   Small bowel obstruction ([place]) 08/21/2022   Abnormal CT of the abdomen 08/20/2022   Vomiting and diarrhea 08/20/2022   Ulcerative colitis, acute ([place]) 01/27/2019   Malnutrition of moderate degree 07/29/2015   Anemia 07/26/2015   UC (ulcerative colitis) ([place]) 07/26/2015   Celiac disease 07/26/2015   PCP:  [facility]:   [facility] [address] [place] - [place] [address] Phone: [phone] Fax: [place], [place] [address] Phone: [phone] Fax: [phone]     Social Determinants of Health (SDOH) Social History: SDOH Screenings   Food Insecurity: No Food Insecurity (08/20/2022)  Housing: Low Risk  (08/20/2022)  Transportation Needs: No Transportation Needs (08/20/2022)  Utilities: Not At Risk (08/20/2022)  Tobacco Use: Low Risk  (08/21/2022)   SDOH Interventions:     Readmission Risk Interventions     No data to display

## 2022-08-21 NOTE — Progress Notes (Signed)
Initial Nutrition Assessment  DOCUMENTATION CODES:   Not applicable  INTERVENTION:   RD will monitor for diet advancement vs the need for nutrition support.   Pt at high refeed risk  Daily weights   Check vitamins B12 and folate   Pt at high risk for nutrient deficiencies r/t her h/o UC and colectomy. Recommend check thiamine, B12, folate, iron, TIBC, ferritin, calcium, zinc, copper and vitamins D, A, E, & K labs yearly.   NUTRITION DIAGNOSIS:   Inadequate oral intake related to acute illness as evidenced by NPO status.  GOAL:   Patient will meet greater than or equal to 90% of their needs  MONITOR:   Diet advancement, Labs, Weight trends, Skin, I & O's  REASON FOR ASSESSMENT:   Malnutrition Screening Tool    ASSESSMENT:   27 y/o female with h/o IDA, biopsy diagnosed celiac disease/UC (diagnosed 2017) s/p robotic total proctocolectomy with ileal pouch-anal anastomosis 11/2019 and SBO s/p loop ileostomy reversal 03/2020 and who is now admitted with suspected obstruction/internal hernia.  Met with pt in room today. Pt is known to this RD from previous admissions. Pt reports good appetite and oral intake at baseline and up until the day of her admission. Pt reports vomiting started after eating pancakes that morning that were not gluten free. Pt reports eating some of a clear liquid diet yesterday; pt was made NPO today. Pt denies any more nausea or vomiting and reports having two liquid BMs today. Pt reports that she has not been taking her daily vitamins. Pt is agreeable to drinking vanilla Ensure once her diet is advanced. Per chart, pt is weight stable pta. Pt currently undergoing gastrografin challenge today. RD will add supplements with diet advancement. Pt is at refeed risk.   Will check B12 and folate labs as pt with h/o resection. Recommend yearly vitamin checks as pt is at high risk for deficiency.   Medications reviewed and include: mesalamine, protonix, NaCl w/ 5%  dextrose and Kcl @50ml$ /hr  Labs reviewed: K 3.2(L) Hgb 9.0(L), Hct 28.7(L)  NUTRITION - FOCUSED PHYSICAL EXAM:  Flowsheet Row Most Recent Value  Orbital Region No depletion  Upper Arm Region No depletion  Thoracic and Lumbar Region No depletion  Buccal Region No depletion  Temple Region No depletion  Clavicle Bone Region Mild depletion  Clavicle and Acromion Bone Region Mild depletion  Scapular Bone Region No depletion  Dorsal Hand No depletion  Patellar Region No depletion  Anterior Thigh Region No depletion  Posterior Calf Region No depletion  Edema (RD Assessment) None  Hair Reviewed  Eyes Reviewed  Mouth Reviewed  Skin Reviewed  Nails Reviewed   Diet Order:   Diet Order             Diet NPO time specified Except for: Sips with Meds, Ice Chips  Diet effective now                  EDUCATION NEEDS:   Education needs have been addressed  Skin:  Skin Assessment: Reviewed RN Assessment  Last BM:  2/23- TYPE 7  Height:   Ht Readings from Last 1 Encounters:  08/20/22 5' 5"$  (1.651 m)    Weight:   Wt Readings from Last 1 Encounters:  08/20/22 57 kg    Ideal Body Weight:  56.8 kg  BMI:  Body mass index is 20.91 kg/m.  Estimated Nutritional Needs:   Kcal:  1700-2000kcal/day  Protein:  85-100g/day  Fluid:  1.7-2.0L/day  Koleen Distance MS,  RD, LDN Please refer to [facility] for RD and/or RD on-call/weekend/after hours pager

## 2022-08-22 ENCOUNTER — Inpatient Hospital Stay: Payer: Medicaid Other

## 2022-08-22 LAB — MAGNESIUM: Magnesium: 2 mg/dL (ref 1.7–2.4)

## 2022-08-22 LAB — BASIC METABOLIC PANEL
Anion gap: 7 (ref 5–15)
BUN: 6 mg/dL (ref 6–20)
CO2: 24 mmol/L (ref 22–32)
Calcium: 8.3 mg/dL — ABNORMAL LOW (ref 8.9–10.3)
Chloride: 105 mmol/L (ref 98–111)
Creatinine, Ser: 0.62 mg/dL (ref 0.44–1.00)
GFR, Estimated: >60 mL/min (ref >60)
Glucose, Bld: 91 mg/dL (ref 70–99)
Potassium: 3.2 mmol/L — ABNORMAL LOW (ref 3.5–5.1)
Sodium: 136 mmol/L (ref 135–145)

## 2022-08-22 LAB — PHOSPHORUS: Phosphorus: 4.6 mg/dL (ref 2.5–4.6)

## 2022-08-22 LAB — VITAMIN B12: Vitamin B-12: 434 pg/mL (ref 180–914)

## 2022-08-22 LAB — FOLATE: Folate: 9.2 ng/mL (ref >5.9)

## 2022-08-22 LAB — POTASSIUM: Potassium: 3.9 mmol/L (ref 3.5–5.1)

## 2022-08-22 MED ORDER — POTASSIUM CHLORIDE 10 MEQ/100ML IV SOLN: 10.0000 meq | INTRAVENOUS | Status: AC

## 2022-08-22 NOTE — Progress Notes (Addendum)
CC: SBO Subjective:  27 year old female with history of ulcerative colitis status post total abdominal colectomy with ileoanal J-pouch at [facility] 3 years ago ( Initially subtotal colectomy w Ileostomy  and then reversal of ileostomy and creation of ileoanal J pouch) .  Now presents with partial small bowel obstruction.  She has some persistent abdominal distention, persistent near syncopal episodes and persistent sentinel loop of bowel within the left upper quadrant suggesting ongoing partial small bowel obstruction. Labs ok. Wbc trending down + Bms  Objective: Vital signs in last 24 hours: Temp:  [97.6 F (36.4 C)-98.6 F (37 C)] 98 F (36.7 C) (02/24 0847) Pulse Rate:  [65-73] 71 (02/24 0847) Resp:  [18-20] 20 (02/24 0847) BP: (100-103)/(69-71) 101/71 (02/24 0847) SpO2:  [100 %] 100 % (02/24 0847) Last BM Date : 08/21/22  Intake/Output from previous day: 02/23 0701 - 02/24 0700 In: 1283 [I.V.:1283] Out: -  Intake/Output this shift: No intake/output data recorded.  Physical exam:  NAD alert Abd: soft, mildly distended w decrease bs, no peritonitis and rebound. A bit uncomfortable   Lab Results: CBC  Recent Labs    08/20/22 1245 08/21/22 0252  WBC 20.4* 7.0  HGB 11.4* 9.0*  HCT 36.7 28.7*  PLT 503* 368   BMET Recent Labs    08/21/22 0252 08/22/22 0522  NA 135 136  K 3.2* 3.2*  CL 104 105  CO2 26 24  GLUCOSE 97 91  BUN 7 6  CREATININE 0.63 0.62  CALCIUM 8.3* 8.3*   PT/INR No results for input(s): "LABPROT", "INR" in the last 72 hours. ABG No results for input(s): "PHART", "HCO3" in the last 72 hours.  Invalid input(s): "PCO2", "PO2"  Studies/Results: DG ABD ACUTE 2+V W 1V CHEST  Result Date: 08/22/2022 CLINICAL DATA:  Ileus EXAM: DG ABDOMEN ACUTE WITH 1 VIEW CHEST COMPARISON:  09/19/2022 FINDINGS: Persistent colonic dilatation consistent with distal obstruction versus ileus. No radiopaque calculi or other significant radiographic abnormality is seen.  Heart size and mediastinal contours are within normal limits. Both lungs are clear. IMPRESSION: Dilated colon consistent with distal obstruction or ileus without interval change. No acute cardiopulmonary disease. Electronically Signed   By: Sammie Bench M.D.   On: 08/22/2022 11:18   DG Abd Portable 1V-Small Bowel Obstruction Protocol-initial, 8 hr delay  Result Date: 08/21/2022 CLINICAL DATA:  8 hour small-bowel follow-through. EXAM: PORTABLE ABDOMEN - 1 VIEW COMPARISON:  August 21, 2022 (7:57 a.m.) FINDINGS: Air-filled loops of distended bowel are again seen throughout the abdomen with mild interval prominence in bowel dilatation since the prior study. Radiopaque contrast is seen within the pelvis, overlying the site of the patient's ileoanal anastomosis. No radio-opaque calculi are seen. IMPRESSION: Mild interval prominence in bowel dilatation since the prior study, consistent with a partial small bowel obstruction. Electronically Signed   By: Virgina Norfolk M.D.   On: 08/21/2022 19:48   DG Abd 1 View  Result Date: 08/21/2022 CLINICAL DATA:  Ulcerative colitis, left lower abdominal pain, dilated small bowel EXAM: ABDOMEN - 1 VIEW COMPARISON:  08/20/2022 FINDINGS: Prior colectomy, with dilated small bowel loops in the left upper quadrant and central abdomen; previous exam showed a transition point in the left upper quadrant with swirl vessels possibly from internal hernia or small bowel volvulus. Bowel caliber up to about 4.6 cm in diameter, similar to prior. No additional significant findings. IMPRESSION: 1. Persistent dilated small bowel loops in the left upper quadrant and central abdomen, with a transition point in the left upper quadrant  better shown on prior CT. This may be due to internal hernia or small bowel volvulus. Electronically Signed   By: Van Clines M.D.   On: 08/21/2022 08:18   CT ABDOMEN PELVIS W CONTRAST  Result Date: 08/20/2022 CLINICAL DATA:  LEFT lower quadrant  abdominal pain, ulcerative colitis, diarrhea, vomiting, has J-pouch EXAM: CT ABDOMEN AND PELVIS WITH CONTRAST TECHNIQUE: Multidetector CT imaging of the abdomen and pelvis was performed using the standard protocol following bolus administration of intravenous contrast. RADIATION DOSE REDUCTION: This exam was performed according to the departmental dose-optimization program which includes automated exposure control, adjustment of the mA and/or kV according to patient size and/or use of iterative reconstruction technique. CONTRAST:  152m OMNIPAQUE IOHEXOL 300 MG/ML SOLN IV. No oral contrast. COMPARISON:  01/27/2019 FINDINGS: Lower chest: Lung bases clear Hepatobiliary: Gallbladder and liver normal appearance Pancreas: Normal appearance Spleen: Normal appearance Adrenals/Urinary Tract: Adrenal glands, kidneys, ureters, and bladder normal appearance. No urinary tract calcification or dilatation. Stomach/Bowel: Prior colectomy with ileoanal anastomosis. No definite bowel wall thickening. Dilated small bowel loop in the LEFT upper quadrant 6.4 cm diameter associated with swirling of the mesenteric vessels. This dilated loop appears anterior to the stomach, new. Possibility of an internal hernia or closed loop obstruction is raised. Stomach and remaining bowel loops unremarkable. Vascular/Lymphatic: Vascular structures patent. Several small RIGHT perirectal nodules/nodes are identified, measuring 7 mm, 7 mm, and 5 mm in sizes, nonspecific but new. Reproductive: Unremarkable uterus and RIGHT adnexa. Complex cystic lesion LEFT ovary 4.4 x 3.4 x 4.4 cm containing septations and irregularity. Other: No free air or free fluid. No hernia. Small amount of edema in presacral space inferiorly. Musculoskeletal: Osseous structures unremarkable. IMPRESSION: Prior colectomy with ileoanal anastomosis. Dilated small bowel loop in the LEFT upper quadrant 6.4 cm diameter associated with swirling of the mesenteric vessels, located anterior  to the stomach, new; possibility of an internal hernia or closed loop obstruction is raised. Several small RIGHT perirectal nodules/nodes are identified, nonspecific but new. Complex cystic lesion LEFT ovary 4.4 x 3.4 x 4.4 cm containing septations and irregularity; recommend further characterization by pelvic and transvaginal ultrasound. Electronically Signed   By: MLavonia DanaM.D.   On: 08/20/2022 15:45    Anti-infectives: Anti-infectives (From admission, onward)    None       Assessment/Plan:  27year old female with history of ulcerative colitis status [facility]  Now presents with partial small bowel obstruction.  She has some persistent abdominal distention, persistent near syncopal episodes and persistent sentinel loop of bowel within the left upper quadrant suggesting ongoing partial small bowel obstruction. Correct K Keep CLD Extensive discussion with patient and family.  I do not think that she is ready to go home and I think that her obstruction is not getting better.  She does have Gastrografin that she was able to evacuate.  Not a clear-cut picture.  Discussed with her that surgical intervention might be necessary as she has that persistent small bowel loop that has not changed for the last 2 days.  If they wish to explore the possibility of [facility]  I have started the transferring process but also was honest with them that majority of times we are unable to transfer them because of shortages at large medical centers. She clearly has no need for emergent surgical intervention as there is no evidence of sepsis or peritonitis. Brent greater than 50 minutes into encounter including personally reviewing  imaging studies, extensive counseling of the patient and the family, performing appropriate documentation and coordination of her care. Caroleen Hamman, MD, [facility]  08/22/2022

## 2022-08-23 ENCOUNTER — Inpatient Hospital Stay: Payer: Medicaid Other

## 2022-08-23 LAB — CBC
HCT: 28.4 % — ABNORMAL LOW (ref 36.0–46.0)
Hemoglobin: 9 g/dL — ABNORMAL LOW (ref 12.0–15.0)
MCH: 26.2 pg (ref 26.0–34.0)
MCHC: 31.7 g/dL (ref 30.0–36.0)
MCV: 82.8 fL (ref 80.0–100.0)
Platelets: 366 10*3/uL (ref 150–400)
RBC: 3.43 MIL/uL — ABNORMAL LOW (ref 3.87–5.11)
RDW: 13.2 % (ref 11.5–15.5)
WBC: 7.1 10*3/uL (ref 4.0–10.5)
nRBC: 0 % (ref 0.0–0.2)

## 2022-08-23 LAB — COMPREHENSIVE METABOLIC PANEL
ALT: 71 U/L — ABNORMAL HIGH (ref 0–44)
AST: 49 U/L — ABNORMAL HIGH (ref 15–41)
Albumin: 3.2 g/dL — ABNORMAL LOW (ref 3.5–5.0)
Alkaline Phosphatase: 41 U/L (ref 38–126)
Anion gap: 5 (ref 5–15)
BUN: 6 mg/dL (ref 6–20)
CO2: 23 mmol/L (ref 22–32)
Calcium: 8.4 mg/dL — ABNORMAL LOW (ref 8.9–10.3)
Chloride: 107 mmol/L (ref 98–111)
Creatinine, Ser: 0.61 mg/dL (ref 0.44–1.00)
GFR, Estimated: >60 mL/min (ref >60)
Glucose, Bld: 99 mg/dL (ref 70–99)
Potassium: 3.5 mmol/L (ref 3.5–5.1)
Sodium: 135 mmol/L (ref 135–145)
Total Bilirubin: 0.7 mg/dL (ref 0.3–1.2)
Total Protein: 6.9 g/dL (ref 6.5–8.1)

## 2022-08-23 LAB — MAGNESIUM: Magnesium: 1.9 mg/dL (ref 1.7–2.4)

## 2022-08-23 MED ORDER — ONDANSETRON HCL 4 MG PO TABS: 4.0000 mg | ORAL_TABLET | Freq: Three times a day (TID) | ORAL | 0 refills | Status: AC | PRN

## 2022-08-23 NOTE — Plan of Care (Signed)

## 2022-08-24 NOTE — Discharge Summary (Signed)
Patient ID: Julie Bentley MRN: [id number] DOB/AGE: [birth year] 27 y.o.  Admit date: 08/20/2022 Discharge date: 08/24/2022   Discharge Diagnoses:  Principal Problem:   Abnormal CT of the abdomen Active Problems:   Vomiting and diarrhea   Small bowel obstruction [facility])     Hospital Course:  27 yo female with a history of ulcerative colitis and celiac disease.  She did have total colectomy with ileostomy subsequently followed 6 months later by ileoanal anastomosis with J-pouch.  This was done approximately 3-1/2 years ago at [facility] by Dr. Sheryn Bison. He was admitted by Dr. Christian Mate on 22 February questionable small bowel obstruction and the CT scan was questionable and an internal hernia.  She was promptly evaluated by the general surgery team who clinical the patient did not had an acute abdomen I was not behaving like a closed-loop obstruction.  She was admitted for hydration, serial abdominal exams and a Gastrografin challenge.  She did have a Gastrografin challenge with evidence of passage of contrast.  She was able to tolerate diet.  Serial exams revealed persistent distention and serial x-rays revealed persistent left upper quadrant small bowel dilation.  I was covering for the weekend for Dr. Christian Mate and I have spent significant amount of time especially with our body radiologist.  We have looked at all the images for the last 3 years including multiple CTs as well as KUB.  We do not think that she has a closed-loop obstruction or internal hernia however we think there is a persistent loop that is potentially concerning for an obstruction.  This can also be concerning or explained by pouchitis or potential twisting of the pouch.  I have done my due diligence and discussed transferring with [facility] and they have refused transfers at this time. We were able to place her on a clear liquid diet which she tolerated very well.  She continues to have bowel function but  continued to feel lightheaded and half some abdominal distention. There is an extensive discussion with the patient and the mother given the inability for [place] to get her to [facility] they wish me to discharge the patient.  They do have a close relationship with the GI doctor and her surgeon at [facility] and tomorrow they will make appropriate appointments and the mother was saying that [facility] might be able to direct admit her tomorrow. So had serial labs showing hypokalemia.  I actually placed the order for KCl but the mother was against that and demanded and a stat potassium level.  We rechecked the potassium and it was already 3.9 and we canceled the IV replacement. I have certainly care to this patient and have done her best to accommodate her wishes To certainly a complex case and she had fairly complex surgery that is fraught with potential complications.  I do feel that she is better care for at [facility] by her original specialist colorectal surgeon. Mother was given alarming signs in case the patient did not do well.  She also knows to either take her back to the emergency room at [facility] or at [facility].  They expressed understanding At the time of discharge the patient was ambulating, .  Her vital signs were stable and she was afebrile.   physical exam at discharge showed a pt  in no acute distress.  Awake and alert.  Abdomen: Soft, mildly distended, no peritonitis.  Extremities well-perfused and no edema.  Condition of the patient  the time of discharge was stable Please note that I have spent at least 1 hour in this encounter and arranging discharge and providing extensive counseling to the patient.    Disposition: Discharge disposition: 01-Home or Self Care       Discharge Instructions     Call MD for:  difficulty breathing, headache or visual disturbances   Complete by: As directed    Call MD for:  extreme fatigue   Complete by: As directed     Call MD for:  hives   Complete by: As directed    Call MD for:  persistant dizziness or light-headedness   Complete by: As directed    Call MD for:  persistant nausea and vomiting   Complete by: As directed    Call MD for:  redness, tenderness, or signs of infection (pain, swelling, redness, odor or green/yellow discharge around incision site)   Complete by: As directed    Call MD for:  severe uncontrolled pain   Complete by: As directed    Call MD for:  temperature >100.4   Complete by: As directed    Diet - low sodium heart healthy   Complete by: As directed    Increase activity slowly   Complete by: As directed       Allergies as of 08/23/2022       Reactions   Gluten Meal         Medication List     STOP taking these medications    benzonatate 200 MG capsule Commonly known as: TESSALON   meclizine 25 MG tablet Commonly known as: ANTIVERT   Vitamin D (Ergocalciferol) 1.25 MG (50000 UNIT) Caps capsule Commonly known as: DRISDOL       TAKE these medications    acetaminophen 325 MG tablet Commonly known as: TYLENOL Take 2 tablets (650 mg total) by mouth every 6 (six) hours as needed for mild pain (or Fever >/= 101).   CENTRUM MULTI + OMEGA 3 PO Take 1 tablet by mouth daily.   cetirizine 10 MG tablet Commonly known as: ZYRTEC Take 1 tablet by mouth daily as needed for allergies.   mesalamine 1000 MG suppository Commonly known as: CANASA Place 1,000 mg rectally at bedtime.   ondansetron 4 MG tablet Commonly known as: Zofran Take 1 tablet (4 mg total) by mouth every 8 (eight) hours as needed for nausea or vomiting.   pseudoephedrine 120 MG 12 hr tablet Commonly known as: Sudafed 12 Hour Take 1 tablet (120 mg total) by mouth 2 (two) times daily.        Caroleen Hamman, MD FACS

## 2022-11-10 ENCOUNTER — Other Ambulatory Visit: Payer: Self-pay | Admitting: Podiatry

## 2022-12-02 ENCOUNTER — Encounter: Payer: Self-pay | Admitting: Podiatry

## 2022-12-04 NOTE — Anesthesia Preprocedure Evaluation (Signed)
Anesthesia Evaluation  Patient identified by MRN, date of birth, ID band Patient awake    Reviewed: Allergy & Precautions, H&P , NPO status , Patient's Chart, lab work & pertinent test results  History of Anesthesia Complications (+) Family history of anesthesia reaction  Airway Mallampati: II  TM Distance: >3 FB Neck ROM: Full    Dental no notable dental hx.  Braces on teeth:   Pulmonary neg pulmonary ROS  Sinus drainage from seasonal allergies, breath sounds are clear, coughs with sinus drainage at times Pulmonary exam normal breath sounds clear to auscultation       Cardiovascular negative cardio ROS Normal cardiovascular exam Rhythm:Regular Rate:Normal  . 02-04-19 echo The left ventricle has hyperdynamic systolic function, with an  ejection fraction of >65%. The cavity size was normal. Left ventricular  diastolic parameters were normal. No evidence of left ventricular regional  wall motion abnormalities.   2. The right ventricle has normal systolic function. The cavity was  mildly enlarged. There is no increase in right ventricular wall thickness.  Right ventricular systolic pressure could not be assessed.   3. Trivial pericardial effusion is present.   4. The aortic valve is tricuspid. No stenosis of the aortic valve.   5. The aorta is normal in size and structure.   6. The aortic root, ascending aorta and aortic arch are normal in size  and structure.   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 Echo today shows trivial PR, no  significant PS, normal PV velocities.        Neuro/Psych  Headaches  negative psych ROS   GI/Hepatic Neg liver ROS, PUD,,,  Endo/Other  negative endocrine ROS    Renal/GU negative Renal ROS  negative genitourinary   Musculoskeletal negative musculoskeletal ROS (+)    Abdominal   Peds negative pediatric ROS (+)   Hematology  (+) Blood dyscrasia, anemia   Anesthesia Other Findings UC (ulcerative colitis)  Celiac disease Ileostomy in place   Family history of adverse reaction to anesthesia--anesthesia caused "boils on her feet" of mother "years ago" Narrowed leaky pulmonary heart valve--normal per echo 2020 Migraine headache    Reproductive/Obstetrics negative OB ROS                              Anesthesia Physical Anesthesia Plan  ASA: 3  Anesthesia Plan:    Post-op Pain Management:    Induction:   PONV Risk Score and Plan:   Airway Management Planned:   Additional Equipment:   Intra-op Plan:   Post-operative Plan:   Informed Consent:   Plan Discussed with:   Anesthesia Plan Comments:          Anesthesia Quick Evaluation

## 2022-12-04 NOTE — Discharge Instructions (Signed)
[facility]  POST OPERATIVE INSTRUCTIONS FOR DR. FOWLER AND [facility] PODIATRY [facility]PART[facility]   Take your medication as prescribed.  Pain medication should be taken only as needed.  Keep the dressing clean, dry and intact.  Keep your foot elevated above the heart level for the first 48 hours.  Walking to the bathroom and brief periods of walking are acceptable, unless we have instructed you to be non-weight bearing.  Always wear your post-op shoe when walking.  Always use your crutches if you are to be non-weight bearing.  Do not take a shower. Baths are permissible as long as the foot is kept out of the water.   Every hour you are awake:  Bend your knee 15 times. Flex foot 15 times Massage calf 15 times  Call [facility] ([phone]) if any of the following problems occur: You develop a temperature or fever. The bandage becomes saturated with blood. Medication does not stop your pain. Injury of the foot occurs. Any symptoms of infection including redness, odor, or red streaks running from wound. 

## 2022-12-09 ENCOUNTER — Other Ambulatory Visit: Payer: Self-pay

## 2022-12-09 ENCOUNTER — Encounter: Payer: Self-pay | Admitting: Podiatry

## 2022-12-09 ENCOUNTER — Ambulatory Visit: Payer: Medicaid Other | Admitting: Anesthesiology

## 2022-12-09 ENCOUNTER — Encounter
Admission: RE | Disposition: A | Discharge status: Home / Self Care | Payer: Self-pay | Source: Home / Self Care | Attending: Podiatry

## 2022-12-09 ENCOUNTER — Ambulatory Visit: Payer: Self-pay

## 2022-12-09 ENCOUNTER — Ambulatory Visit
Admission: RE | Admit: 2022-12-09 | Discharge: 2022-12-09 | Disposition: A | Discharge status: Home / Self Care | Payer: Medicaid Other | Attending: Podiatry | Admitting: Podiatry

## 2022-12-09 DIAGNOSIS — M2011 Hallux valgus (acquired), right foot: Secondary | ICD-10-CM | POA: Insufficient documentation

## 2022-12-09 DIAGNOSIS — Z932 Ileostomy status: Secondary | ICD-10-CM | POA: Insufficient documentation

## 2022-12-09 HISTORY — DX: Family history of other specified conditions: Z84.89

## 2022-12-09 HISTORY — DX: Nonrheumatic pulmonary valve stenosis with insufficiency: I37.2

## 2022-12-09 HISTORY — DX: Migraine, unspecified, not intractable, without status migrainosus: G43.909

## 2022-12-09 HISTORY — PX: WEIL OSTEOTOMY: SHX5044

## 2022-12-09 HISTORY — PX: BUNIONECTOMY: SHX129

## 2022-12-09 HISTORY — DX: Personal history of other specified conditions: Z87.898

## 2022-12-09 LAB — POCT PREGNANCY, URINE: Preg Test, Ur: NEGATIVE

## 2022-12-09 SURGERY — BUNIONECTOMY
Anesthesia: Choice | Site: Toe | Laterality: Right

## 2022-12-09 MED ORDER — ONDANSETRON HCL 4 MG PO TABS: 4.0000 mg | ORAL_TABLET | Freq: Four times a day (QID) | ORAL | Status: DC | PRN

## 2022-12-09 MED ORDER — BUPIVACAINE HCL (PF) 0.25 % IJ SOLN
INTRAMUSCULAR | Status: DC | PRN
  Administered 2022-12-09: 10 mL

## 2022-12-09 MED ORDER — SODIUM CHLORIDE 0.9 % IR SOLN
Status: DC | PRN
  Administered 2022-12-09: 500 mL

## 2022-12-09 MED ORDER — MIDAZOLAM HCL 2 MG/2ML IJ SOLN
INTRAMUSCULAR | Status: DC | PRN
  Administered 2022-12-09 (×2): 2 mg via INTRAVENOUS

## 2022-12-09 MED ORDER — ROCURONIUM BROMIDE 100 MG/10ML IV SOLN
INTRAVENOUS | Status: DC | PRN
  Administered 2022-12-09: 50 mg via INTRAVENOUS

## 2022-12-09 MED ORDER — METOCLOPRAMIDE HCL 5 MG/ML IJ SOLN: 5.0000 mg | Freq: Three times a day (TID) | INTRAMUSCULAR | Status: DC | PRN

## 2022-12-09 MED ORDER — LACTATED RINGERS IV SOLN: INTRAVENOUS | Status: DC | PRN

## 2022-12-09 MED ORDER — KETOROLAC TROMETHAMINE 60 MG/2ML IM SOLN
INTRAMUSCULAR | Status: DC | PRN
  Administered 2022-12-09 (×2): 15 mg via INTRAMUSCULAR

## 2022-12-09 MED ORDER — DEXAMETHASONE SODIUM PHOSPHATE 10 MG/ML IJ SOLN
INTRAMUSCULAR | Status: DC | PRN
  Administered 2022-12-09: 4 mg via INTRAVENOUS

## 2022-12-09 MED ORDER — METOCLOPRAMIDE HCL 5 MG PO TABS: 5.0000 mg | ORAL_TABLET | Freq: Three times a day (TID) | ORAL | Status: DC | PRN

## 2022-12-09 MED ORDER — PROPOFOL 10 MG/ML IV BOLUS
INTRAVENOUS | Status: DC | PRN
  Administered 2022-12-09: 200 mg via INTRAVENOUS

## 2022-12-09 MED ORDER — LACTATED RINGERS IV SOLN: INTRAVENOUS | Status: DC

## 2022-12-09 MED ORDER — CEFAZOLIN SODIUM-DEXTROSE 2-4 GM/100ML-% IV SOLN
2.0000 g | INTRAVENOUS | Status: AC
  Administered 2022-12-09: 2 g via INTRAVENOUS

## 2022-12-09 MED ORDER — ONDANSETRON HCL 4 MG/2ML IJ SOLN: 4.0000 mg | Freq: Four times a day (QID) | INTRAMUSCULAR | Status: DC | PRN

## 2022-12-09 MED ORDER — DEXMEDETOMIDINE HCL IN NACL 200 MCG/50ML IV SOLN
INTRAVENOUS | Status: DC | PRN
  Administered 2022-12-09 (×2): 8 ug via INTRAVENOUS

## 2022-12-09 MED ORDER — NALOXONE HCL 0.4 MG/ML IJ SOLN
INTRAMUSCULAR | Status: DC | PRN
  Administered 2022-12-09 (×2): .2 mg via INTRAVENOUS

## 2022-12-09 MED ORDER — SCOPOLAMINE 1 MG/3DAYS TD PT72
1.0000 | MEDICATED_PATCH | Freq: Once | TRANSDERMAL | Status: DC
  Administered 2022-12-09: 1.5 mg via TRANSDERMAL

## 2022-12-09 MED ORDER — LIDOCAINE HCL (CARDIAC) PF 100 MG/5ML IV SOSY
PREFILLED_SYRINGE | INTRAVENOUS | Status: DC | PRN
  Administered 2022-12-09: 100 mg via INTRAVENOUS

## 2022-12-09 MED ORDER — FLUMAZENIL 0.5 MG/5ML IV SOLN
INTRAVENOUS | Status: DC | PRN
  Administered 2022-12-09 (×2): .2 mg via INTRAVENOUS

## 2022-12-09 MED ORDER — BUPIVACAINE LIPOSOME 1.3 % IJ SUSP
INTRAMUSCULAR | Status: DC | PRN
  Administered 2022-12-09: 10 mL

## 2022-12-09 MED ORDER — OXYCODONE-ACETAMINOPHEN 5-325 MG PO TABS: 1.0000 | ORAL_TABLET | Freq: Four times a day (QID) | ORAL | 0 refills | Status: DC | PRN

## 2022-12-09 MED ORDER — SUGAMMADEX SODIUM 200 MG/2ML IV SOLN
INTRAVENOUS | Status: DC | PRN
  Administered 2022-12-09: 200 mg via INTRAVENOUS

## 2022-12-09 MED ORDER — PROPOFOL 500 MG/50ML IV EMUL
INTRAVENOUS | Status: DC | PRN
  Administered 2022-12-09: 180 ug/kg/min via INTRAVENOUS

## 2022-12-09 MED ORDER — FENTANYL CITRATE (PF) 100 MCG/2ML IJ SOLN
INTRAMUSCULAR | Status: DC | PRN
  Administered 2022-12-09: 100 ug via INTRAVENOUS

## 2022-12-09 SURGICAL SUPPLY — 54 items
APL SKNCLS STERI-STRIP NONHPOA (GAUZE/BANDAGES/DRESSINGS) ×2
BENZOIN TINCTURE PRP APPL 2/3 (GAUZE/BANDAGES/DRESSINGS) ×2 IMPLANT
BLADE OSC/SAGITTAL MD 9X18.5 (BLADE) IMPLANT
BLADE SAW LAPIPLASTY 40X11 (BLADE) IMPLANT
BLADE SURG 15 STRL LF DISP TIS (BLADE) IMPLANT
BLADE SURG 15 STRL SS (BLADE) ×2
BNDG CMPR 5X4 CHSV STRCH STRL (GAUZE/BANDAGES/DRESSINGS) ×2
BNDG CMPR 75X41 PLY HI ABS (GAUZE/BANDAGES/DRESSINGS) ×2
BNDG CMPR STD VLCR NS LF 5.8X4 (GAUZE/BANDAGES/DRESSINGS) ×2
BNDG COHESIVE 4X5 TAN STRL LF (GAUZE/BANDAGES/DRESSINGS) ×2 IMPLANT
BNDG ELASTIC 4X5.8 VLCR NS LF (GAUZE/BANDAGES/DRESSINGS) ×2 IMPLANT
BNDG ESMARCH 4 X 12 STRL LF (GAUZE/BANDAGES/DRESSINGS) ×2
BNDG ESMARCH 4X12 STRL LF (GAUZE/BANDAGES/DRESSINGS) ×2 IMPLANT
BNDG GAUZE DERMACEA FLUFF 4 (GAUZE/BANDAGES/DRESSINGS) ×2 IMPLANT
BNDG GZE DERMACEA 4 6PLY (GAUZE/BANDAGES/DRESSINGS) ×2
BNDG STRETCH 4X75 STRL LF (GAUZE/BANDAGES/DRESSINGS) ×2 IMPLANT
BOOT STEPPER DURA MED (SOFTGOODS) IMPLANT
CANISTER SUCT 1200ML W/VALVE (MISCELLANEOUS) ×2 IMPLANT
CLIP FIXATION STAPLE 10X10X10 (Staple) IMPLANT
COVER LIGHT HANDLE UNIVERSAL (MISCELLANEOUS) ×4 IMPLANT
CUFF TOURN SGL QUICK 18X4 (TOURNIQUET CUFF) IMPLANT
DRAPE FLUOR MINI C-ARM 54X84 (DRAPES) ×2 IMPLANT
DURAPREP 26ML APPLICATOR (WOUND CARE) ×2 IMPLANT
ELECT REM PT RETURN 9FT ADLT (ELECTROSURGICAL) ×2
ELECTRODE REM PT RTRN 9FT ADLT (ELECTROSURGICAL) ×2 IMPLANT
GAUZE SPONGE 4X4 12PLY STRL (GAUZE/BANDAGES/DRESSINGS) ×2 IMPLANT
GAUZE XEROFORM 1X8 LF (GAUZE/BANDAGES/DRESSINGS) ×2 IMPLANT
GLOVE SRG 8 PF TXTR STRL LF DI (GLOVE) ×4 IMPLANT
GLOVE SURG ENC MOIS LTX SZ7.5 (GLOVE) ×4 IMPLANT
GLOVE SURG UNDER POLY LF SZ8 (GLOVE) ×4
GOWN SPEC L4 XLG W/TWL (GOWN DISPOSABLE) ×2 IMPLANT
GOWN STRL REUS W/ TWL LRG LVL3 (GOWN DISPOSABLE) ×4 IMPLANT
GOWN STRL REUS W/TWL LRG LVL3 (GOWN DISPOSABLE) ×4
K-WIRE DBL END TROCAR 6X.045 (WIRE) ×2
KIT PROCEDURE DRILL (DRILL) IMPLANT
KIT TURNOVER KIT A (KITS) ×2 IMPLANT
KWIRE DBL END TROCAR 6X.045 (WIRE) IMPLANT
LAPIPLASTY SYS 4A (Orthopedic Implant) ×2 IMPLANT
NS IRRIG 500ML POUR BTL (IV SOLUTION) ×2 IMPLANT
PACK EXTREMITY ARMC (MISCELLANEOUS) ×2 IMPLANT
RASP SM TEAR CROSS CUT (RASP) IMPLANT
SCREW 2.7 HIGH PITCH LOCKING (Screw) IMPLANT
SCREW HIGH PITCH LOCK 2.7 (Screw) IMPLANT
STOCKINETTE IMPERVIOUS LG (DRAPES) ×2 IMPLANT
STRIP CLOSURE SKIN 1/4X4 (GAUZE/BANDAGES/DRESSINGS) ×2 IMPLANT
SUT ETHILON 3-0 (SUTURE) IMPLANT
SUT MNCRL 4-0 (SUTURE) ×2
SUT MNCRL 4-0 27XMFL (SUTURE) ×2
SUT VIC AB 3-0 SH 27 (SUTURE) ×4
SUT VIC AB 3-0 SH 27X BRD (SUTURE) IMPLANT
SUT VIC AB 4-0 SH 27 (SUTURE) ×2
SUT VIC AB 4-0 SH 27XANBCTRL (SUTURE) IMPLANT
SUTURE MNCRL 4-0 27XMF (SUTURE) ×2 IMPLANT
SYSTEM LAPIPLASTY 4A (Orthopedic Implant) IMPLANT

## 2022-12-09 NOTE — Op Note (Signed)
Operative note   Surgeon:Camika Marsico Armed forces logistics/support/administrative officer: None    Preop diagnosis: Hallux valgus deformity right foot    Postop diagnosis: Same    Procedure: 1.Lapidus hallux valgus correction right foot   2.  Akin proximal phalanx osteotomy right great toe 3.  Intraoperative fluoroscopy use without assistance of radiologist    EBL: Minimal    Anesthesia:local and general.  Local consists of a total of 20 cc of a one-to-one mixture of 0.25% bupivacaine and Exparel long-acting anesthetic    Hemostasis: Midcalf tourniquet inflated to 200 mmHg for approximately 100 minutes    Specimen: None    Complications: None    Operative indications:Julie Bentley is an 27 y.o. that presents today for surgical intervention.  The risks/benefits/alternatives/complications have been discussed and consent has been given.    Procedure:  Patient was brought into the OR and placed on the operating table in thesupine position. After anesthesia was obtained theleft lower extremity was prepped and draped in usual sterile fashion.  Attention was directed to the dorsal aspect of the foot where a dorsal incision was made at the first met cuneiforms joint.  Sharp and blunt dissection was carried down to the periosteum.  Subperiosteal dissection was then undertaken.  This exposed the first met cuneiform joint.  This was then freed and loosened.  A small fulcrum was placed between the base of the first metatarsal and second metatarsal.  Next the joint positioner was placed on the medial aspect of the metatarsal.  A small stab incision was made at the second metatarsal.  Compression was placed for the positioner.  Good realignment of the first intermetatarsal angle was noted at this time.  Attention was then directed to the dorsomedial first MTPJ where an incision was performed.  Sharp and blunt dissection was carried down to the capsule.  The intermetatarsal space was then entered.  The conjoined tendon of the abductor was  then freed from the base of the proximal phalanx.  Attention was to redirected to the first met cuneiform joint.  At this time the osteotomy cut guide was placed into the joint region.  2 vertical cuts were then made.  The cartilage material was removed from the first met cuneiform joint and the joint was then prepped with a 2.0 mm drill bit.  The joint compressor was then placed.  Good compression and realignment was noted.  Next the medial and dorsal locking plates were then placed from the lapiplasty set.  Realignment and stability was noted to the intermetatarsal angle region.  There was still some mild residual valgus deformity of the great toe and at this time the [facility] osteotomy was performed.  Attention was directed to the midshaft of the proximal phalanx.  Next a small wedge of bone with the apex lateral was removed from the midshaft of the proximal phalanx.  This was then stabilized with a 10 x 10 compression bone staple.  Good realignment was noted at this time.  Fluoroscopy was used throughout the entire procedure for evaluation of bone cuts and realignment.  All wounds were flushed with copious amounts of irrigation.  Attention was redirected to the dorsomedial first MTPJ.  A small T capsulotomy was performed.  The dorsomedial eminence was noted and transected and smoothed with a power rasp.  A small capsulorrhaphy was performed.  Closure was then performed after all areas were irrigated.  3-0 Vicryl for the capsular tissue.  4-0 Vicryl in subcutaneous tissue and a 4-0 Monocryl for  the skin.   Further local anesthesia was performed at the end of the case.    Patient tolerated the procedure and anesthesia well.  Was transported from the OR to the PACU with all vital signs stable and vascular status intact. To be discharged per routine protocol.  Will follow up in approximately 1 week in the outpatient clinic.

## 2022-12-09 NOTE — Anesthesia Procedure Notes (Addendum)
Procedure Name: Intubation Date/Time: 12/09/2022 12:05 PM  Performed by: Genia Del, CRNAPre-anesthesia Checklist: Patient identified, Patient being monitored, Timeout performed, Emergency Drugs available and Suction available Patient Re-evaluated:Patient Re-evaluated prior to induction Oxygen Delivery Method: Circle system utilized Preoxygenation: Pre-oxygenation with 100% oxygen Induction Type: IV induction Ventilation: Mask ventilation without difficulty Laryngoscope Size: Mac and 3 Grade View: Grade I Tube type: Oral Tube size: 7.0 mm Number of attempts: 1 Airway Equipment and Method: Stylet Placement Confirmation: ETT inserted through vocal cords under direct vision, positive ETCO2 and breath sounds checked- equal and bilateral Secured at: 22 cm Tube secured with: Tape Dental Injury: Teeth and Oropharynx as per pre-operative assessment

## 2022-12-09 NOTE — Transfer of Care (Signed)
Immediate Anesthesia Transfer of Care Note  Patient: Dereck Ligas  Procedure(s) Performed: BUNIONECTOMY LAPIDUS TYPE (Right: Toe) PHALANX OSTEOTOMY - AKIN (Right: Foot)  Patient Location: PACU  Anesthesia Type:General  Level of Consciousness: sedated  Airway & Oxygen Therapy: Patient Spontanous Breathing and Patient connected to face mask oxygen  Post-op Assessment: Report given to RN and Post -op Vital signs reviewed and stable  Post vital signs: Reviewed and stable  Last Vitals:See fl.ow sheet Vitals Value Taken Time  BP 100/61 12/09/22 1458  Temp    Pulse 92 12/09/22 1500  Resp 25 12/09/22 1500  SpO2 100 % 12/09/22 1500  Vitals shown include unvalidated device data.  Last Pain:  Vitals:   12/09/22 1130  TempSrc: Temporal  PainSc: 0-No pain         Complications: No notable events documented.

## 2022-12-09 NOTE — H&P (Signed)
HISTORY AND PHYSICAL INTERVAL NOTE:  12/09/2022  11:39 AM  Julie Bentley  has presented today for surgery, with the diagnosis of M20.11 - Hallux valgus of right foot.  The various methods of treatment have been discussed with the patient.  No guarantees were given.  After consideration of risks, benefits and other options for treatment, the patient has consented to surgery.  I have reviewed the patients' chart and labs.     A history and physical examination was performed in my office.  The patient was reexamined.  There have been no changes to this history and physical examination.  Gwyneth Revels A

## 2022-12-09 NOTE — Anesthesia Postprocedure Evaluation (Signed)
Anesthesia Post Note  Patient: Julie Bentley  Procedure(s) Performed: BUNIONECTOMY LAPIDUS TYPE (Right: Toe) PHALANX OSTEOTOMY - AKIN (Right: Foot)  Patient location during evaluation: PACU Anesthesia Type: General Level of consciousness: awake and alert Pain management: pain level controlled Vital Signs Assessment: post-procedure vital signs reviewed and stable Respiratory status: spontaneous breathing, nonlabored ventilation, respiratory function stable and patient connected to nasal cannula oxygen Cardiovascular status: blood pressure returned to baseline and stable Postop Assessment: no apparent nausea or vomiting Anesthetic complications: no   No notable events documented.   Last Vitals:  Vitals:   12/09/22 1545 12/09/22 1600  BP: 98/65 101/72  Pulse: 79 65  Resp: 20 15  Temp:  36.7 C  SpO2: 100% 98%    Last Pain:  Vitals:   12/09/22 1600  TempSrc:   PainSc: 0-No pain                 Hazelene Doten C Stacy Deshler

## 2022-12-10 ENCOUNTER — Encounter: Payer: Self-pay | Admitting: Podiatry

## 2023-06-09 ENCOUNTER — Other Ambulatory Visit: Payer: Self-pay | Admitting: Podiatry

## 2023-06-09 DIAGNOSIS — M9689 Other intraoperative and postprocedural complications and disorders of the musculoskeletal system: Secondary | ICD-10-CM

## 2023-06-15 ENCOUNTER — Inpatient Hospital Stay: Admission: RE | Admit: 2023-06-15 | Payer: Medicaid Other | Source: Ambulatory Visit

## 2023-06-16 ENCOUNTER — Ambulatory Visit
Admission: RE | Admit: 2023-06-16 | Discharge: 2023-06-16 | Disposition: A | Discharge status: Home / Self Care | Payer: Medicaid Other | Source: Ambulatory Visit | Attending: Podiatry | Admitting: Podiatry

## 2023-06-16 DIAGNOSIS — M9689 Other intraoperative and postprocedural complications and disorders of the musculoskeletal system: Secondary | ICD-10-CM

## 2023-07-26 ENCOUNTER — Other Ambulatory Visit: Payer: Self-pay | Admitting: Podiatry

## 2023-07-28 ENCOUNTER — Other Ambulatory Visit: Payer: Self-pay

## 2023-07-28 ENCOUNTER — Encounter: Payer: Self-pay | Admitting: Podiatry

## 2023-07-28 NOTE — Anesthesia Preprocedure Evaluation (Addendum)
Anesthesia Evaluation  Patient identified by MRN, date of birth, ID band Patient awake    Reviewed: Allergy & Precautions, H&P , NPO status , Patient's Chart, lab work & pertinent test results  History of Anesthesia Complications (+) Family history of anesthesia reaction  Airway Mallampati: I  TM Distance: >3 FB Neck ROM: Full    Dental no notable dental hx.    Pulmonary neg pulmonary ROS   Pulmonary exam normal breath sounds clear to auscultation       Cardiovascular negative cardio ROS Normal cardiovascular exam Rhythm:Regular Rate:Normal  02-02-19  1. The left ventricle has hyperdynamic systolic function, with an  ejection fraction of >65%. The cavity size was normal. Left ventricular  diastolic parameters were normal. No evidence of left ventricular regional  wall motion abnormalities.   2. The right ventricle has normal systolic function. The cavity was  mildly enlarged. There is no increase in right ventricular wall thickness.  Right ventricular systolic pressure could not be assessed.   3. Trivial pericardial effusion is present.   4. The aortic valve is tricuspid. No stenosis of the aortic valve.   5. The aorta is normal in size and structure.   6. The aortic root, ascending aorta and aortic arch are normal in size  and structure.   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 Echo today shows trivial PR, no  significant PS, normal PV velocities.     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, J-pouch creation, diverting loop ileostomy 11/2019, ileostomy takedown 03/2020, celiac disease   Endo/Other  negative endocrine ROS    Renal/GU negative Renal ROS  negative genitourinary   Musculoskeletal negative musculoskeletal ROS (+)    Abdominal    Peds negative pediatric ROS (+)  Hematology negative hematology ROS (+) Blood dyscrasia, anemia   Anesthesia Other Findings UC (ulcerative colitis)  Celiac disease Ileostomy in place   Family history of adverse reaction to anesthesia "anesthesia caused patient's mother to have boils on her feet," this is not congruent with, nor a sequelae of, any "anesthesia" with which I am acquainted.   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, self-corrected without any intervention Migraine headache 07-22-23 autoimmune hepatitis Hx of motion sickness  Anemia Seasonal allergies  Liver disease Chronic allergic rhinitis   Patient states she is "slow to wake up" Motion sickness   Reproductive/Obstetrics negative OB ROS                             Anesthesia Physical Anesthesia Plan  ASA: 3  Anesthesia Plan: General   Post-op Pain Management:    Induction: Intravenous  PONV Risk Score and Plan:   Airway Management Planned: Natural Airway and Nasal Cannula  Additional Equipment:   Intra-op Plan:   Post-operative Plan: Extubation in OR  Informed Consent: I have reviewed the patients History and Physical, chart, labs and discussed the procedure including the risks, benefits and alternatives for the proposed anesthesia with the patient or authorized representative who has indicated his/her understanding and acceptance.     Dental Advisory Given  Plan Discussed with: Anesthesiologist, CRNA and Surgeon  Anesthesia Plan Comments: (Patient consented for risks of anesthesia including but not limited to:  - adverse reactions to medications - damage to eyes, teeth, lips or other oral  mucosa - nerve damage due to positioning  - sore throat or hoarseness - Damage to heart, brain, nerves, lungs, other parts of body or loss of life  Patient voiced understanding and assent.)        Anesthesia Quick Evaluation

## 2023-07-29 ENCOUNTER — Ambulatory Visit: Payer: Medicaid Other

## 2023-07-29 DIAGNOSIS — Z719 Counseling, unspecified: Secondary | ICD-10-CM

## 2023-07-29 DIAGNOSIS — Z23 Encounter for immunization: Secondary | ICD-10-CM | POA: Diagnosis not present

## 2023-07-29 NOTE — Progress Notes (Signed)
Pt in clinic with mom and sister for Pfizer vaccine. Eligible per [facility], Given VIS, administered for 15 minutes without problems. Given [facility] copy, explained and understood. M.Elijan Googe, LPN.

## 2023-08-09 NOTE — Discharge Instructions (Signed)

## 2023-08-11 ENCOUNTER — Encounter: Payer: Self-pay | Admitting: Podiatry

## 2023-08-11 ENCOUNTER — Ambulatory Visit: Payer: Medicaid Other | Admitting: Anesthesiology

## 2023-08-11 ENCOUNTER — Ambulatory Visit
Admission: RE | Admit: 2023-08-11 | Discharge: 2023-08-11 | Disposition: A | Discharge status: Home / Self Care | Payer: Medicaid Other | Attending: Podiatry | Admitting: Podiatry

## 2023-08-11 ENCOUNTER — Encounter
Admission: RE | Disposition: A | Discharge status: Home / Self Care | Payer: Self-pay | Source: Home / Self Care | Attending: Podiatry

## 2023-08-11 ENCOUNTER — Other Ambulatory Visit: Payer: Self-pay

## 2023-08-11 ENCOUNTER — Ambulatory Visit: Payer: Self-pay

## 2023-08-11 DIAGNOSIS — K900 Celiac disease: Secondary | ICD-10-CM | POA: Diagnosis not present

## 2023-08-11 DIAGNOSIS — Z8711 Personal history of peptic ulcer disease: Secondary | ICD-10-CM | POA: Insufficient documentation

## 2023-08-11 DIAGNOSIS — M960 Pseudarthrosis after fusion or arthrodesis: Secondary | ICD-10-CM | POA: Diagnosis present

## 2023-08-11 HISTORY — DX: Anemia, unspecified: D64.9

## 2023-08-11 HISTORY — DX: Motion sickness, initial encounter: T75.3XXA

## 2023-08-11 HISTORY — PX: ORIF TOE FRACTURE: SHX5032

## 2023-08-11 HISTORY — DX: Liver disease, unspecified: K76.9

## 2023-08-11 HISTORY — DX: Other seasonal allergic rhinitis: J30.2

## 2023-08-11 HISTORY — DX: Allergic rhinitis, unspecified: J30.9

## 2023-08-11 LAB — POCT PREGNANCY, URINE: Preg Test, Ur: NEGATIVE

## 2023-08-11 SURGERY — OPEN REDUCTION INTERNAL FIXATION (ORIF) METATARSAL (TOE) FRACTURE
Anesthesia: Monitor Anesthesia Care | Site: Toe | Laterality: Right

## 2023-08-11 MED ORDER — FENTANYL CITRATE (PF) 100 MCG/2ML IJ SOLN
INTRAMUSCULAR | Status: AC
  Filled 2023-08-11: qty 2

## 2023-08-11 MED ORDER — EPHEDRINE 5 MG/ML INJ
INTRAVENOUS | Status: AC
  Filled 2023-08-11: qty 5

## 2023-08-11 MED ORDER — DEXAMETHASONE SODIUM PHOSPHATE 4 MG/ML IJ SOLN
INTRAMUSCULAR | Status: DC | PRN
  Administered 2023-08-11: 4 mg via INTRAVENOUS

## 2023-08-11 MED ORDER — ONDANSETRON HCL 4 MG/2ML IJ SOLN
INTRAMUSCULAR | Status: AC
  Filled 2023-08-11: qty 2

## 2023-08-11 MED ORDER — PROPOFOL 10 MG/ML IV BOLUS
INTRAVENOUS | Status: DC | PRN
  Administered 2023-08-11: 200 mg via INTRAVENOUS
  Administered 2023-08-11: 20 ug/kg/min via INTRAVENOUS

## 2023-08-11 MED ORDER — PHENYLEPHRINE HCL (PRESSORS) 10 MG/ML IV SOLN
INTRAVENOUS | Status: DC | PRN
  Administered 2023-08-11: 80 ug via INTRAVENOUS

## 2023-08-11 MED ORDER — MIDAZOLAM HCL 2 MG/2ML IJ SOLN
INTRAMUSCULAR | Status: AC
  Filled 2023-08-11: qty 2

## 2023-08-11 MED ORDER — ACETAMINOPHEN 10 MG/ML IV SOLN
INTRAVENOUS | Status: AC
  Filled 2023-08-11: qty 100

## 2023-08-11 MED ORDER — SODIUM CHLORIDE 0.9 % IV SOLN: INTRAVENOUS | Status: DC

## 2023-08-11 MED ORDER — OXYCODONE HCL 5 MG PO TABS
10.0000 mg | ORAL_TABLET | Freq: Once | ORAL | Status: AC
  Administered 2023-08-11: 10 mg via ORAL

## 2023-08-11 MED ORDER — ONDANSETRON HCL 4 MG/2ML IJ SOLN
INTRAMUSCULAR | Status: DC | PRN
  Administered 2023-08-11: 4 mg via INTRAVENOUS

## 2023-08-11 MED ORDER — EPHEDRINE SULFATE (PRESSORS) 50 MG/ML IJ SOLN
INTRAMUSCULAR | Status: DC | PRN
Start: 2023-08-11 — End: 2023-08-11
  Administered 2023-08-11: 5 mg via INTRAVENOUS

## 2023-08-11 MED ORDER — CEFAZOLIN SODIUM-DEXTROSE 2-3 GM-%(50ML) IV SOLR
INTRAVENOUS | Status: AC
  Filled 2023-08-11: qty 50

## 2023-08-11 MED ORDER — CEFAZOLIN SODIUM-DEXTROSE 2-4 GM/100ML-% IV SOLN
2.0000 g | INTRAVENOUS | Status: AC
  Administered 2023-08-11: 2 g via INTRAVENOUS

## 2023-08-11 MED ORDER — ACETAMINOPHEN 10 MG/ML IV SOLN
INTRAVENOUS | Status: DC | PRN
  Administered 2023-08-11: 1000 mg via INTRAVENOUS

## 2023-08-11 MED ORDER — PROPOFOL 10 MG/ML IV BOLUS
INTRAVENOUS | Status: AC
Start: 2023-08-11 — End: ?
  Filled 2023-08-11: qty 20

## 2023-08-11 MED ORDER — LIDOCAINE HCL (CARDIAC) PF 100 MG/5ML IV SOSY
PREFILLED_SYRINGE | INTRAVENOUS | Status: DC | PRN
  Administered 2023-08-11: 100 mg via INTRATRACHEAL

## 2023-08-11 MED ORDER — PROPOFOL 10 MG/ML IV BOLUS
INTRAVENOUS | Status: AC
  Filled 2023-08-11: qty 20

## 2023-08-11 MED ORDER — OXYCODONE HCL 5 MG PO TABS
ORAL_TABLET | ORAL | Status: AC
  Filled 2023-08-11: qty 2

## 2023-08-11 MED ORDER — PHENYLEPHRINE 80 MCG/ML (10ML) SYRINGE FOR IV PUSH (FOR BLOOD PRESSURE SUPPORT)
PREFILLED_SYRINGE | INTRAVENOUS | Status: AC
  Filled 2023-08-11: qty 10

## 2023-08-11 MED ORDER — OXYCODONE HCL 5 MG PO TABS: 5.0000 mg | ORAL_TABLET | Freq: Three times a day (TID) | ORAL | 0 refills | Status: AC | PRN

## 2023-08-11 MED ORDER — FENTANYL CITRATE (PF) 100 MCG/2ML IJ SOLN
INTRAMUSCULAR | Status: DC | PRN
  Administered 2023-08-11: 100 ug via INTRAVENOUS

## 2023-08-11 MED ORDER — DEXAMETHASONE SODIUM PHOSPHATE 4 MG/ML IJ SOLN
INTRAMUSCULAR | Status: AC
  Filled 2023-08-11: qty 1

## 2023-08-11 MED ORDER — MIDAZOLAM HCL 5 MG/5ML IJ SOLN
INTRAMUSCULAR | Status: DC | PRN
  Administered 2023-08-11: 2 mg via INTRAVENOUS

## 2023-08-11 MED ORDER — BUPIVACAINE LIPOSOME 1.3 % IJ SUSP
INTRAMUSCULAR | Status: DC | PRN
  Administered 2023-08-11: 15 mL

## 2023-08-11 SURGICAL SUPPLY — 56 items
Akin Plate ×1 IMPLANT
Akin Plate Compression ×1 IMPLANT
BANDAGE ELASTIC 4 LF NS (GAUZE/BANDAGES/DRESSINGS) ×1 IMPLANT
BIT DRILL 100X1.3XAO CNCT (BIT) IMPLANT
BIT DRL 100X1.3XAO CNCT (BIT) ×1 IMPLANT
BLADE SURG 15 STRL LF DISP TIS (BLADE) IMPLANT
BLADE SURG MINI STRL (BLADE) ×2 IMPLANT
BNDG COHESIVE 4X5 TAN STRL (GAUZE/BANDAGES/DRESSINGS) ×1 IMPLANT
BNDG ESMARK 4X12 TAN STRL LF (GAUZE/BANDAGES/DRESSINGS) ×1 IMPLANT
BNDG GAUZE 4.5X4.1 6PLY STRL (MISCELLANEOUS) ×1 IMPLANT
BNDG STRETCH 4X75 STRL LF (GAUZE/BANDAGES/DRESSINGS) ×1 IMPLANT
BOOT STEPPER DURA MED (SOFTGOODS) IMPLANT
CANISTER SUCT 1200ML W/VALVE (MISCELLANEOUS) ×1 IMPLANT
COVER PIN YLW 0.028-062 (MISCELLANEOUS) IMPLANT
DRAPE FLUOR MINI C-ARM 54X84 (DRAPES) ×1 IMPLANT
DURAPREP 26ML APPLICATOR (WOUND CARE) ×1 IMPLANT
ELECT REM PT RETURN 9FT ADLT (ELECTROSURGICAL) ×1 IMPLANT
ELECTRODE REM PT RTRN 9FT ADLT (ELECTROSURGICAL) ×1 IMPLANT
GAUZE SPONGE 4X4 12PLY STRL (GAUZE/BANDAGES/DRESSINGS) ×1 IMPLANT
GAUZE XEROFORM 1X8 LF (GAUZE/BANDAGES/DRESSINGS) ×1 IMPLANT
GLOVE BIOGEL PI IND STRL 8 (GLOVE) ×1 IMPLANT
GLOVE SURG SS PI 7.5 STRL IVOR (GLOVE) ×1 IMPLANT
GOWN STRL REUS W/ TWL LRG LVL3 (GOWN DISPOSABLE) ×2 IMPLANT
K-WIRE DBL END TROCAR 6X.045 (WIRE) IMPLANT
K-WIRE DBL END TROCAR 6X.062 (WIRE) IMPLANT
K-WIRE SMOOTH 1.6X150MM (WIRE) ×2 IMPLANT
KIT ASP BONE MRW 11G (KITS) IMPLANT
KIT TURNOVER KIT A (KITS) ×1 IMPLANT
KWIRE DBL END TROCAR 6X.045 (WIRE) IMPLANT
KWIRE DBL END TROCAR 6X.062 (WIRE) IMPLANT
KWIRE SMOOTH 1.6X150MM (WIRE) IMPLANT
NDL FILTER BLUNT 18X1 1/2 (NEEDLE) ×1 IMPLANT
NDL HYPO 27GX1-1/4 (NEEDLE) ×1 IMPLANT
NEEDLE FILTER BLUNT 18X1 1/2 (NEEDLE) ×1 IMPLANT
NEEDLE HYPO 27GX1-1/4 (NEEDLE) ×1 IMPLANT
NS IRRIG 500ML POUR BTL (IV SOLUTION) ×1 IMPLANT
PACK EXTREMITY ARMC (MISCELLANEOUS) ×1 IMPLANT
PLATE 10 1.1THK MED AKIN (Plate) IMPLANT
PLATE 12 HX2 COMPR AKIN (Plate) IMPLANT
PUTTY DBX 1CC (Putty) ×1 IMPLANT
PUTTY DBX 1CC DEPUY (Putty) IMPLANT
SCREW 2.0 X9 LOCKING (Screw) ×2 IMPLANT
SCREW LOCK 16X2X BB GORILLA (Screw) IMPLANT
SCREW LOCK PLATE 2.0X8 (Screw) IMPLANT
SCREW LOCKING 2.0X14 (Screw) IMPLANT
SCREW N LOCK PLATE 2.0X9 (Screw) IMPLANT
STIMULATOR BONE GROWTH EMG EXT (ORTHOPEDIC SUPPLIES) IMPLANT
STOCKINETTE IMPERVIOUS LG (DRAPES) ×1 IMPLANT
STRAP BODY AND KNEE 60X3 (MISCELLANEOUS) ×1 IMPLANT
STRIP CLOSURE SKIN 1/4X4 (GAUZE/BANDAGES/DRESSINGS) ×1 IMPLANT
SUT MNCRL 4-0 27 PS-2 XMFL (SUTURE) ×1 IMPLANT
SUT VIC AB 3-0 SH 27X BRD (SUTURE) IMPLANT
SUT VIC AB 4-0 FS2 27 (SUTURE) ×1 IMPLANT
SUTURE MNCRL 4-0 27XMF (SUTURE) IMPLANT
SYR 10ML LL (SYRINGE) ×1 IMPLANT
WIRE OLIVE SMOOTH 1.3 (WIRE) IMPLANT

## 2023-08-11 NOTE — Anesthesia Postprocedure Evaluation (Signed)
Anesthesia Post Note  Patient: Julie Bentley  Procedure(s) Performed: [phone] - SURGICAL REPAIR OF METATARSAL BONES IN THE FOOT (Right: Toe)  Patient location during evaluation: PACU Anesthesia Type: MAC Level of consciousness: awake and alert Pain management: pain level controlled Vital Signs Assessment: post-procedure vital signs reviewed and stable Respiratory status: spontaneous breathing, nonlabored ventilation, respiratory function stable and patient connected to nasal cannula oxygen Cardiovascular status: blood pressure returned to baseline and stable Postop Assessment: no apparent nausea or vomiting Anesthetic complications: no   No notable events documented.   Last Vitals:  Vitals:   08/11/23 1415 08/11/23 1505  BP: 113/67 110/65  Pulse: 93 87  Resp: (!) 24 20  Temp: (!) 36.4 C 36.7 C  SpO2: 100% 100%    Last Pain:  Vitals:   08/11/23 1505  TempSrc:   PainSc: 0-No pain                 Austine Kelsay C Sapphire Tygart

## 2023-08-11 NOTE — Anesthesia Procedure Notes (Signed)
Procedure Name: LMA Insertion Date/Time: 08/11/2023 12:20 PM  Performed by: Elisabeth Pigeon, CRNAPre-anesthesia Checklist: Patient identified, Emergency Drugs available, Suction available and Patient being monitored Patient Re-evaluated:Patient Re-evaluated prior to induction Oxygen Delivery Method: Circle system utilized Preoxygenation: Pre-oxygenation with 100% oxygen Induction Type: IV induction Ventilation: Mask ventilation without difficulty LMA: LMA inserted LMA Size: 3.0 Tube type: Oral Number of attempts: 1 Airway Equipment and Method: Oral airway Placement Confirmation: ETT inserted through vocal cords under direct vision, positive ETCO2 and breath sounds checked- equal and bilateral Tube secured with: Tape Dental Injury: Teeth and Oropharynx as per pre-operative assessment

## 2023-08-11 NOTE — H&P (Signed)
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  The various methods of treatment have been discussed with the patient.  No guarantees were given.  After consideration of risks, benefits and other options for treatment, the patient has consented to surgery.  I have reviewed the patients' chart and labs.     A history and physical examination was performed in my office.  The patient was reexamined.  There have been no changes to this history and physical examination.  Gwyneth Revels A

## 2023-08-11 NOTE — Op Note (Signed)
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  Repair of nonunion right great toe 2.  Harvest of bone graft for repair of nonunion right great toe 3.  Intraoperative fluoroscopy use without assistance of radiologist    EBL: Minimal    Anesthesia:local and general local consists of a total of 15 cc of a one-to-one mixture of 0.25% plain bupivacaine and Exparel long-acting anesthetic around the first ray.  Additional supplementation to the lateral calcaneus with 0.25% bupivacaine at the bone graft harvest site.    Hemostasis: Mid calf tourniquet inflated to 200 mmHg for approximately 85 minutes    Specimen: None    Complications: None    Operative indications:Shakhia J Babe is an 28 y.o. that presents today for surgical intervention.  The risks/benefits/alternatives/complications have been discussed and consent has been given.    Procedure:  Patient was brought into the OR and placed on the operating table in thesupine position. After anesthesia was obtained theright lower extremity was prepped and draped in usual sterile fashion.  Attention was directed to the dorsomedial right first MTPJ where a dorsomedial incision was performed.  Sharp and blunt dissection carried down to the periosteum.  Subperiosteal dissection was then performed.  At this time the proximal phalanx was exposed.  The staple from the previous surgery was noted and removed without incident.  This was freely moved without stress to the surgical site.  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  This was debrided away and all nonviable fibrotic tissue was removed down to good bleeding tissue.  This time the decision to perform bone grafting to the area was made.  A small stab incision was made laterally along the calcaneus.  A trocar was used and introduced into  the calcaneus.  I was able to remove 1 cc of calcaneal bone graft.  I then closed this with a single nylon stitch.  This was then mixed with 1 cc of DBX.  After final flush in preparation of the nonunion site the bone graft mixture was then applied to the surgical site.  Initial stabilization was performed with a 0.0662 K wire from the proximal aspect coursing distally.  Multiple views of fluoroscopy were used to note realignment with reduction of the dorsal angulation of the distal fragment.  At this time a small Akin plate from the [employer] screw set was used with 2 holes proximal and the fracture site and 1 hole distal filled with 2.0 millimeter screws.  A second 2 hole plate with 1 hole proximal and 1 hole distal were placed on the more dorsal lateral fracture site.  Good stabilization was noted.  Final flush was performed.  Further bone grafting at the fracture site was performed.  Closure was then performed with 3-0 Vicryl for the capsule, 4-0 Vicryl the subcutaneous tissue and a 4-0 Monocryl for the skin.  A bulky sterile dressing was applied.  Patient was then placed in a tall equalizer walker boot.    Patient tolerated the procedure and anesthesia well.  Was transported from the OR to the PACU with all vital signs stable and vascular status intact. To be discharged per routine protocol.  Will follow up in approximately 1 week in the outpatient clinic.

## 2023-08-11 NOTE — Transfer of Care (Signed)
Immediate Anesthesia Transfer of Care Note  Patient: Julie Bentley  Procedure(s) Performed: 96295 - SURGICAL REPAIR OF METATARSAL BONES IN THE FOOT (Right: Toe)  Patient Location: PACU  Anesthesia Type: MAC  Level of Consciousness: awake, alert  and patient cooperative  Airway and Oxygen Therapy: Patient Spontanous Breathing and Patient connected to supplemental oxygen  Post-op Assessment: Post-op Vital signs reviewed, Patient's Cardiovascular Status Stable, Respiratory Function Stable, Patent Airway and No signs of Nausea or vomiting  Post-op Vital Signs: Reviewed and stable  Complications: No notable events documented.

## 2023-08-16 ENCOUNTER — Encounter: Payer: Self-pay | Admitting: Podiatry

## 2023-08-17 ENCOUNTER — Encounter: Payer: Self-pay | Admitting: Podiatry

## 2023-08-18 ENCOUNTER — Encounter: Payer: Self-pay | Admitting: Podiatry

## 2023-08-19 ENCOUNTER — Encounter: Payer: Self-pay | Admitting: Podiatry

## 2024-05-15 NOTE — Progress Notes (Deleted)
 [facility] Return Visit   Referring Provider [place], [facility] [address] [place],  [place] [address]  Primary Care Provider [place], [facility]  Patient Profile: Julie Bentley is a 28 y.o. female who returns to the [facility] for follow-up of the problem(s) noted below.  Problem List: @DIAGSHORT @   History of Present Illness   [name] was last seen in the GI office***   Current GI Meds    Interval History   Discussed the use of AI scribe software for clinical note transcription with the patient, who gave verbal consent to proceed.  History of Present Illness     Last colonoscopy: *** Last endoscopy: ***  Last Abd CT/CTE/MRE: ***  GI Review of Symptoms Significant for {GIROS:50592}. Otherwise negative.  General Review of Systems  Review of systems is significant for the pertinent positives and negatives as listed per the HPI.  Full ROS is otherwise negative.  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  resloved by age 54   Seasonal allergies    UC (ulcerative colitis) [facility])      Past Surgical History   Past Surgical History:  Procedure Laterality Date   BUNIONECTOMY Right 12/09/2022   Procedure: BUNIONECTOMY LAPIDUS TYPE;  Surgeon: Ashley Soulier, DPM;  Location: [facility];  Service: Podiatry;  Laterality: Right;   COLECTOMY     has J pouch   FLEXIBLE SIGMOIDOSCOPY N/A 01/29/2019   Procedure:  FLEXIBLE SIGMOIDOSCOPY;  Surgeon: Therisa Bi, MD;  Location: [facility];  Service: Gastroenterology;  Laterality: N/A;   ORIF TOE FRACTURE Right 08/11/2023   Procedure: 28322 - SURGICAL REPAIR OF METATARSAL BONES IN THE FOOT;  Surgeon: Ashley Soulier, DPM;  Location: [facility];  Service: Orthopedics/Podiatry;  Laterality: Right;   WEIL OSTEOTOMY Right 12/09/2022   Procedure: PHALANX OSTEOTOMY - AKIN;  Surgeon: Ashley Soulier, DPM;  Location: [facility];  Service: Podiatry;  Laterality: Right;     Allergies and Medications   Allergies  Allergen Reactions   Gluten Meal    @MEDSTODAY @  Family His   No family history on file. GI Specific Family History: {gifamhx:50061}   Social History   Social History   Tobacco Use   Smoking status: Never   Smokeless tobacco: Never  Vaping Use   Vaping status: Never Used  Substance Use Topics   Alcohol use: No    Comment: rare   Drug use: Never   Neela reports that she has never smoked. She has never used smokeless tobacco. She reports that she does not drink alcohol and does not use drugs.  Vital Signs and Physical Examination   There were no vitals filed for this visit. There is no height or weight on file to calculate BMI.    General: Well developed, well nourished, no acute distress Head:  Normocephalic and atraumatic Eyes: Sclerae anicteric, EOMI Ears: Normal auditory acuity Mouth: No deformities or lesions noted Lungs: Clear throughout to auscultation Heart: Regular rate and rhythm; No murmurs, rubs or bruits Abdomen: Soft, non tender and non distended. No masses, hepatosplenomegaly or hernias noted. Normal Bowel sounds Rectal: Musculoskeletal: Symmetrical with no gross deformities  Pulses:  Normal pulses noted Extremities: No edema or deformities noted Neurological: Alert oriented x 4, grossly nonfocal Psychological:  Alert and cooperative. Normal mood and affect   Review of Data   The following  data was reviewed at the time of this encounter:   Laboratory Studies      Latest Ref Rng & Units 08/23/2022    4:32 AM 08/21/2022    2:52 AM 08/20/2022   12:45 PM  CBC  WBC 4.0 - 10.5 K/uL 7.1  7.0  20.4   Hemoglobin 12.0 - 15.0 g/dL 9.0  9.0  88.5   Hematocrit 36.0 - 46.0 % 28.4  28.7  36.7   Platelets 150 - 400 K/uL 366  368  503     Lab Results  Component Value Date   LIPASE 40 08/20/2022      Latest Ref Rng & Units 08/23/2022    4:32 AM 08/22/2022    2:49 PM 08/22/2022    5:22 AM  CMP  Glucose 70 - 99 mg/dL 99   91   BUN 6 - 20 mg/dL 6   6   Creatinine 9.55 - 1.00 mg/dL 9.38   9.37   Sodium 864 - 145 mmol/L 135   136   Potassium 3.5 - 5.1 mmol/L 3.5  3.9  3.2   Chloride 98 - 111 mmol/L 107   105   CO2 22 - 32 mmol/L 23   24   Calcium 8.9 - 10.3 mg/dL 8.4   8.3   Total Protein 6.5 - 8.1 g/dL 6.9     Total Bilirubin 0.3 - 1.2 mg/dL 0.7     Alkaline Phos 38 - 126 U/L 41     AST 15 - 41 U/L 49     ALT 0 - 44 U/L 71        Imaging Studies    GI Procedures and Studies      Clinical Impression  It is my clinical impression that Ms. Effertz is a 28 y.o. female with;  ***  Plan  *** *** *** *** ***   Planned Follow Up No follow-ups on file.  The patient or caregiver verbalized understanding of the material covered, with no barriers to understanding. All questions were answered. Patient or caregiver is agreeable with the plan outlined above.    It was a pleasure to see [name].  If you have any questions or concerns regarding this evaluation, do not hesitate to contact me.  Inocente Hausen, MD [facility]

## 2024-05-17 ENCOUNTER — Telehealth: Payer: Self-pay

## 2024-05-17 NOTE — Telephone Encounter (Signed)
 Dr. Suzann, Rye is currently an inpatient at [facility].  I do not think she will be in to her appointment on 11/20.  She was admitted 05/11/24.

## 2024-05-17 NOTE — Telephone Encounter (Signed)
 I spoke to Deazia's mom who was on another phone with Claretta on speaker.  I advised her of who I was and that I was calling to verify if she was still in the hospital because I was prepping the charts for Dr. Suzann for tomorrow. Princess was very aggressive during the call.  Because she was on speaker and Savita was on speaker on a completely different phone,  I was not able to hear her clearly.  As I was explaining why I was calling, the mom cut me off and said that I heard her and I was rudely talking over her.  I told her that I didn't hear her and she said that I did but I kept talking.  She told Mareena to explain what she had been through and Zendayah started saying something about her bowel.  I advised her that I didn't hear everything that she said and Rolinda asked her mom if she could hear her and she said yes and that I could hear her too because she has us  on speaker phone.  Then Princess asked me what did I have on file for her Maverick's surgery and I advised her that I would have to review all of the hospital notes to know what was done.  She said that was not what she asked me and told me to tell her what was on file because she knows that I can tell them.  I told her that I could tell her if I knew but since I would have to review her records I could not but Dr. Suzann will be able to at her visit.  I then told the mom that I wasn't sure why she was angry with me and speaking to me the way she was but I only called to verify if Evagelia was in the hospital still.  She started speaking over me and saying that I was trying to make her sound like the angry black woman.  She said that I was deflecting and getting aggressive.  I started speaking to Kindsey and I said that she can give us  a call to reschedule once she is ready and I disconnected the call.

## 2024-05-17 NOTE — Progress Notes (Signed)
 General Surgery Progress Note     Date of Service: 05/17/2024 Hospital Admission Date:   05/11/2024   Hosp day: 5                     Postop day:  5 Days Post-Op   Brief HPI: Julie Bentley is a 28 y.o. 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 Frederik) followed by ileostomy reversal 04/15/20, Celiac disease, autoimmune hepatitis, pulmonic valve stenosis. Presenting with 1 week of abdominal pain with nausea, no emesis. Found on CT and Xray to have pneumoperitoneum (rigler's sign), portal venous gas, mesenteric swirling and volvulus. General Surgery Consulted for management.    She is now Hospital Day: 7 and 5 Days Post-Op status post Procedure(s):Exploratory laparotomy, reduction of volvulus, lysis of single adhesive band of omentum, small bowel resection with anastomosis, primary repair of incisional hernia     24 hour events  - Reports nausea with PO narcotics on empty stomach - WBC 10.3 (10.8) - Foley removed this AM- TOV pending - Hypokalemic this AM; replete- recheck in PM - Ambulating - BM x1 (11/18) Vital Signs     Current Vital Signs 24h Vital Sign Ranges  T 37.3 C (99.1 F) (05/17/24 0803) Temp  Avg: 37.3 C (99.1 F)  Min: 37.1 C (98.7 F)  Max: 37.6 C (99.7 F)  BP 123/75 (05/17/24 0803) BP  Min: 104/65  Max: 123/75  HR 84 (05/17/24 0803) Pulse  Avg: 83  Min: 78  Max: 86  RR 18 (05/17/24 0803) Resp  Avg: 16.8  Min: 15  Max: 18  O2sat 97 % Nasal cannula (05/12/24 0215) SpO2  Avg: 98.3 %  Min: 97 %  Max: 100 %   Intake/Output (24 hours): I/O last 2 completed shifts: In: 980 [P.O.:200; I.V.:30; Other:750] Out: 1725 [Urine:1725] Last Weight: 57 kg (125 lb 11.2 oz) (05/12/24 1445)  Admit Weight: 58.5 kg (128 lb 15.5 oz) (05/11/24 1934)  AFP:Anib mass index is 20.92 kg/m. Height: 165.1 cm (5' 5)    Physical Exam   Physical Exam: General: alert, cooperative, in NAD Eyes: nonicteric, EOMI HENT: mucosa  moist Cardiovascular: RRR Respiratory: nonlabored on room O2 Abdomen: Soft, non-tender, Midline OTA with staples. Non distended Extremities: no lower extremity edema Psych: oriented to time, place and person; mood and affect are appropriate  Data   Labs:  Recent Labs  Lab 05/15/24 0517 05/16/24 0422 05/17/24 0001 05/17/24 0430  NA 133* 134* 134* 136  K 3.0* 2.8*  --  3.1*  CO2 25 30 26 26   BUN 3* 3* 2* 3*  CREATININE 0.7 0.5 0.6 0.6  CALCIUM 7.7* 7.8* 8.1* 8.1*  MG 1.8 1.8  --  1.8   Recent Labs  Lab 05/11/24 2043 05/17/24 0001  ALT 12 14  AST 24 18  ALKPHOS 44 42  TBILI 0.3* 0.9  ALB 3.0* 2.0*     Recent Labs  Lab 05/16/24 0422 05/17/24 0001 05/17/24 0429  WBC 10.6* 10.8* 10.3*  HGB 8.7* 8.4* 8.6*  HCT 27.1* 25.4* 26.5*  PLT 749* 760* 805*   Recent Labs  Lab 05/11/24 2043 05/12/24 0200  APTT 31.8 31.2  INR 1.3* 1.4*     Glucose range:  Recent Labs  Lab 05/13/24 1833  POCGLU 84   Additional Labs: Recent Labs  Lab 05/11/24 2346  COLORU Light Yellow  CLARITYU Clear  SPECGRAV 1.029  LABPH 7.0  PROTEINUA Negative  GLUCOSEU Negative  KETONESU  Negative  BLOODU Trace*  NITRITE Negative  LEUKOCYTESUR Negative  BILIRUBINUR Negative  UROBILINOGEN 0.2  RBCUA 1  WBCUA 1  SQUAMEPI 0  HYALINE 0    Microbiology: Lab Results  Component Value Date   BLDCULT No growth detected. 05/11/2024   BLDCULT No growth detected. 05/11/2024   No results found for: URCULT No results found for: CULTRESP No results found for: GARDNER DUNKS, BFLDC, TISSUEC   Pertinent Imaging:  CTAP 11/13: Impression: 1.  Findings of small bowel obstruction due to suspected volvulus with focal transition point and mesenteric swirling in the left upper quadrant. The presence of pneumoperitoneum raises concern for bowel perforation. 2.  Wall thickening of small bowel in the left hemiabdomen may represent reactive enteritis. 3.  Please see contemporaneously  dictated CT chest report for findings above the diaphragm.  Medications   . acetaminophen   975 mg Oral Q6H SCH  . enoxaparin   40 mg Subcutaneous Daily  . magnesium sulfate  4 g Intravenous Once  . polyethylene glycol  17 g Oral Daily  . potassium chloride  in 0.9% sodium chloride   40 mEq Intravenous Once  . scopolamine   1 patch Transdermal Q72H  . sennosides-docusate  2 tablet Oral BID       dextrose  50% in water, glucagon, HYDROmorphone, hydrOXYzine (ATARAX,VISTARIL) oral, lidocaine , ondansetron , oxyCODONE , promethazine  Assessment & Plan   Hospital Problem List: Active Problems:   Volvulus (CMS/HHS-HCC)   Ileus (CMS/HHS-HCC)  Julie Bentley is a 28 y.o. 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 Frederik) followed by ileostomy reversal 04/15/20, Celiac disease, autoimmune hepatitis, pulmonic valve stenosis. She initially presented to the ED on 11/13 with abdominal pain, CT imaging displayed pneumoperitoneum, portal venous gas, mesenteric swirling and volvulus. She is now 5 Days Post-Op s/p ex-lap with reduction of her volvulus, lysis of one adhesive band of omentum, 30cm SBR, and repair of a prior incisional hernia. Plan to advance diet to PSB and trial epidural pause. 11/19 Epidural and foley removed, encouraging PO intake.  #SBO w/ Volvulus, S/p ex-lap, LOA, SBR, and incisional hernia repair (Dr. Clemetine) - PSB - Pain medication - Encourage OOB - Abx completed 11/18   #Ulcerative Colitis #Autoimmune Hepatitis - Restarted home azathioprine, mesalamine    #Electrolyte abnormalities - Replete and monitor electrolytes  #Other plans - Analgesia: multimodal - Diet: Diet post surgery bland - Bowel regimen: senokot & miralax - Foley: DC on 11/19, trial void pending - Monitor electrolytes & replete as necessary - Encourage ambulation and IS - Home meds: Restarted  Code Status: Full Code GI Prophylaxis: N/A VTE  Prophylaxis: Lovenox , SCD's Disposition: PT Assessment:   RN Assessment: No limitation (05/16/24 2059)) Discharge Planning: pending clinical progression Plan pending review and approval by attending.  SHARMAINE HARDEN, NP [facility]  ACS functional pager: [phone]

## 2024-05-17 NOTE — Progress Notes (Signed)
 Case Management Discharge Planning Note  Barriers identified?  None  EDD: 05/19/2024  Preferred DC location: Home  Medical Readiness for Discharge:  Reassessment Documentation Goals prior to discharge: Medical stability. Mobility: ambulates independently Preferred Discharge Location: Home  Team recommendations and collaboration for discharge planning Home self care   Patient and/or their representative have participated in and are in agreement with discharge plan. yes  CM Next Steps: Follow for medical stability No discharge needs have been Identified. CM will continue to reassess until discharge.  Coordinated Resources:  No resources indicated at this time

## 2024-05-18 ENCOUNTER — Ambulatory Visit: Admitting: Pediatrics
